# Patient Record
Sex: Female | Born: 1985 | Race: White | Hispanic: No | Marital: Single | Smoking: Never smoker
Health system: Southern US, Community
[De-identification: ages and names within clinical notes are randomized; demographics above are authoritative.]

## PROBLEM LIST (undated history)

## (undated) DIAGNOSIS — E039 Hypothyroidism, unspecified: Secondary | ICD-10-CM

## (undated) HISTORY — DX: Hypothyroidism, unspecified: E03.9

## (undated) HISTORY — PX: CHOLECYSTECTOMY: SHX55

---
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
## 2009-08-27 HISTORY — PX: APPENDECTOMY: SHX54

## 2013-07-17 LAB — BASIC METABOLIC PANEL
Creatinine: 1.1 mg/dL (ref 0.5–1.1)
Glucose: 89 mg/dL
Potassium: 4.1 mmol/L (ref 3.4–5.3)
Sodium: 142 mmol/L (ref 137–147)

## 2013-07-17 LAB — LIPASE, BLOOD: Lipase: 149

## 2013-07-17 LAB — HEPATIC FUNCTION PANEL
ALT: 16 U/L (ref 7–35)
AST: 16 U/L (ref 13–35)
Alkaline Phosphatase: 70 U/L (ref 25–125)

## 2013-07-17 LAB — CMP14+LP+1AC+CBC/D/PLT+TSH: Calcium: 9.5 mg/dL

## 2013-07-17 LAB — CBC AND DIFFERENTIAL
Hemoglobin: 13.5 g/dL (ref 12.0–16.0)
Platelets: 362 10*3/uL (ref 150–399)
WBC: 6.8 10^3/mL

## 2014-08-10 ENCOUNTER — Encounter: Payer: Self-pay | Admitting: Family Medicine

## 2014-08-10 ENCOUNTER — Ambulatory Visit (INDEPENDENT_AMBULATORY_CARE_PROVIDER_SITE_OTHER): Payer: BC Managed Care – PPO | Admitting: Family Medicine

## 2014-08-10 VITALS — BP 107/78 | HR 82 | Temp 98.2°F | Wt 144.0 lb

## 2014-08-10 DIAGNOSIS — K253 Acute gastric ulcer without hemorrhage or perforation: Secondary | ICD-10-CM | POA: Diagnosis not present

## 2014-08-10 DIAGNOSIS — E039 Hypothyroidism, unspecified: Secondary | ICD-10-CM | POA: Diagnosis not present

## 2014-08-10 MED ORDER — SUCRALFATE 1 GM/10ML PO SUSP: 1.0000 g | Freq: Three times a day (TID) | ORAL | Status: DC

## 2014-08-10 MED ORDER — PANTOPRAZOLE SODIUM 40 MG PO TBEC: 40.0000 mg | DELAYED_RELEASE_TABLET | Freq: Every day | ORAL | Status: DC

## 2014-08-10 NOTE — Progress Notes (Signed)
CC: Cynthia RancherKelly Pranger is a 28 y.o. female is here for Establish Care and GI issues   Subjective: HPI:  Very pleasant 28 year old here to establish care  Patient complains of left upper quadrant pain that began on the seventh of this month. It is described as sharp and nonradiating. He was initially worse with eating however over the past 48 hours she's had no pain whatsoever and feels like she is back to her regular state of health. On the first day that it presented she had what she believes was a blood in her vomit further described as brown and chunky. She had another episode of this on Saturday this past weekend. She was seen at a local emergency room and was found to have a normal hemoglobin and a CT scan of abdomen and pelvis which was unremarkable.  Liver function was also normal with a normal lipase. Treatment has included sucralfate which greatly improves her symptoms temporarily and she has begun taking Prevacid 50 mg twice a day.  She denies any bright red blood or coffee ground appearance to her emesis.  She has a history of similar issues that happened about a year ago. She even had an upper endoscopy which showed a bleeding ulcer this was treated with 6 months of DEXILANT. And she's been pain-free for many months just prior to the presentation above.   She denies caffeine ingestion, alcohol use nor any chronic nonsteroidal anti-inflammatory use  Review of Systems - General ROS: negative for - chills, fever, night sweats, weight gain or weight loss Ophthalmic ROS: negative for - decreased vision Psychological ROS: negative for - anxiety or depression ENT ROS: negative for - hearing change, nasal congestion, tinnitus or allergies Hematological and Lymphatic ROS: negative for - bleeding problems, bruising or swollen lymph nodes Breast ROS: negative Respiratory ROS: no cough, shortness of breath, or wheezing Cardiovascular ROS: no chest pain or dyspnea on exertion Gastrointestinal ROS: no  current abdominal pain, change in bowel habits, or black or bloody stools. She denies diarrhea or constipation. Genito-Urinary ROS: negative for - genital discharge, genital ulcers, incontinence or abnormal bleeding from genitals Musculoskeletal ROS: negative for - joint pain or muscle pain Neurological ROS: negative for - headaches or memory loss Dermatological ROS: negative for lumps, mole changes, rash and skin lesion changes  Past Medical History  Diagnosis Date  . Hypothyroid     Past Surgical History  Procedure Laterality Date  . Tonsillectomy  1996  . Ankle surgery  2001  . Knee surgery  2006  . Appendectomy  2011  . Cholecystectomy     History reviewed. No pertinent family history.  History   Social History  . Marital Status: Single    Spouse Name: N/A    Number of Children: N/A  . Years of Education: N/A   Occupational History  . Not on file.   Social History Main Topics  . Smoking status: Never Smoker   . Smokeless tobacco: Not on file  . Alcohol Use: 0.0 oz/week    0 Not specified per week     Comment: occasional  . Drug Use: No  . Sexual Activity:    Partners: Female   Other Topics Concern  . Not on file   Social History Narrative  . No narrative on file     Objective: BP 107/78 mmHg  Pulse 82  Temp(Src) 98.2 F (36.8 C) (Oral)  Wt 144 lb (65.318 kg)  LMP 08/09/2014 (Exact Date)  General: Alert and Oriented, No  Acute Distress HEENT: Pupils equal, round, reactive to light. Conjunctivae clearMoist mucous membranes lungs: Clear comfortable work of breathing Cardiac: Regular rate and rhythm.  Abdomen: Normal bowel sounds, soft and non tender without palpable masses. no rebound or guarding  Extremities: No peripheral edema.  Strong peripheral pulses.  Mental Status: No depression, anxiety, nor agitation. Skin: Warm and dry.  Assessment & Plan: Tresa EndoKelly was seen today for establish care and gi issues.  Diagnoses and associated orders for this  visit:  Hypothyroidism, unspecified hypothyroidism type  Acute gastric ulcer - sucralfate (CARAFATE) 1 GM/10ML suspension; Take 10 mLs (1 g total) by mouth 4 (four) times daily -  with meals and at bedtime. - pantoprazole (PROTONIX) 40 MG tablet; Take 1 tablet (40 mg total) by mouth daily.     suspect gastric ulcer has returned, discussed using sucralfate only on an as-needed basis and that if she begins Protonix 40 mg daily this should have fully healed within 3 months and she can stop Protonix and taper down antacid regimen with ranitidine 150 mg daily for 1 week. Signs and symptoms requring emergent/urgent reevaluation were discussed with the patient.  Hypothyroidism: Clinically controlled continue levothyroxine 100 g on a daily basis will be due for TSH in the spring  Return if symptoms worsen or fail to improve.

## 2014-08-13 ENCOUNTER — Encounter: Payer: Self-pay | Admitting: Family Medicine

## 2014-08-13 DIAGNOSIS — Z8719 Personal history of other diseases of the digestive system: Secondary | ICD-10-CM | POA: Insufficient documentation

## 2014-08-31 ENCOUNTER — Ambulatory Visit (INDEPENDENT_AMBULATORY_CARE_PROVIDER_SITE_OTHER): Payer: BC Managed Care – PPO | Admitting: Family Medicine

## 2014-08-31 ENCOUNTER — Encounter: Payer: Self-pay | Admitting: Family Medicine

## 2014-08-31 VITALS — BP 129/78 | HR 97 | Temp 97.8°F | Wt 143.0 lb

## 2014-08-31 DIAGNOSIS — K921 Melena: Secondary | ICD-10-CM | POA: Diagnosis not present

## 2014-08-31 DIAGNOSIS — R1013 Epigastric pain: Secondary | ICD-10-CM | POA: Diagnosis not present

## 2014-08-31 DIAGNOSIS — K253 Acute gastric ulcer without hemorrhage or perforation: Secondary | ICD-10-CM | POA: Diagnosis not present

## 2014-08-31 LAB — POCT HEMOGLOBIN: Hemoglobin: 11.6 g/dL — AB (ref 12.2–16.2)

## 2014-08-31 MED ORDER — PANTOPRAZOLE SODIUM 40 MG PO TBEC: 40.0000 mg | DELAYED_RELEASE_TABLET | Freq: Two times a day (BID) | ORAL | Status: DC

## 2014-08-31 MED ORDER — TRAMADOL HCL 50 MG PO TABS: 50.0000 mg | ORAL_TABLET | Freq: Three times a day (TID) | ORAL | Status: DC | PRN

## 2014-08-31 MED ORDER — SUCRALFATE 1 GM/10ML PO SUSP: 1.0000 g | Freq: Three times a day (TID) | ORAL | Status: DC

## 2014-08-31 NOTE — Progress Notes (Signed)
CC: Cynthia Jennings is a 29 y.o. female is here for black tarry diarrhea/abd pain   Subjective: HPI:  Reports left upper quadrant pain that began yesterday with an abrupt onset that was accompanied by a a sensation of lightheadedness. Symptoms have been persistent ever since onset. Abdominal pain seems to be worsening now 7 out of 10 in severity. It is radiating into the back. It is worse with pressing on the abdomen or when eating. this morning she's had passage of loose liquidy black stool. Overall she states she feels unwell. She tells me that symptoms are identical to a bleeding ulcer that she had a little over a year ago.  She continues to take Protonix 40 mg daily, she's had no need to take sucralfate since I saw her last. She has not taken any nonsteroidal anti-inflammatory since I saw her last. She reports decreased appetite but no nausea or vomiting. Denies fevers, chills, cough, shortness of breath, myalgias, norare there any genitourinary complaints.   Review Of Systems Outlined In HPI  Past Medical History  Diagnosis Date  . Hypothyroid     Past Surgical History  Procedure Laterality Date  . Tonsillectomy  1996  . Ankle surgery  2001  . Knee surgery  2006  . Appendectomy  2011  . Cholecystectomy     No family history on file.  History   Social History  . Marital Status: Single    Spouse Name: N/A    Number of Children: N/A  . Years of Education: N/A   Occupational History  . Not on file.   Social History Main Topics  . Smoking status: Never Smoker   . Smokeless tobacco: Not on file  . Alcohol Use: 0.0 oz/week    0 Not specified per week     Comment: occasional  . Drug Use: No  . Sexual Activity:    Partners: Female   Other Topics Concern  . Not on file   Social History Narrative  . No narrative on file     Objective: BP 129/78 mmHg  Pulse 97  Temp(Src) 97.8 F (36.6 C) (Oral)  Wt 143 lb (64.864 kg)  LMP 08/09/2014 (Exact Date)  General: Alert and  Oriented, No Acute Distress HEENT: Pupils equal, round, reactive to light. Conjunctivae clear.  External ears unremarkable, canals clear with intact TMs with appropriate landmarks.  Middle ear appears open without effusion. Pink inferior turbinates. Moist mucous membranes Lungs: Clear to auscultation bilaterally, no wheezing/ronchi/rales.  Comfortable work of breathing. Good air movement. Cardiac: Regular rate and rhythm. Normal S1/S2.  No murmurs, rubs, nor gallops.   Abdomen: Normal bowel sounds, soft without rebound or rigidity. Pain reproduced with palpation of epigastric and left upper quadrant area no palpable splenomegaly or hepatomegaly. No palpable masses. Extremities: No peripheral edema.  Strong peripheral pulses.  Mental Status: No depression, anxiety, nor agitation. Skin: Warm and dry.  Assessment & Plan: [name] was seen today for black tarry diarrhea/abd pain.  Diagnoses and associated orders for this visit:  Blood in stool - POCT hemoglobin - Ambulatory referral to Gastroenterology  Abdominal pain, epigastric - traMADol (ULTRAM) 50 MG tablet; Take 1 tablet (50 mg total) by mouth every 8 (eight) hours as needed for moderate pain. - Ambulatory referral to Gastroenterology  Acute gastric ulcer - sucralfate (CARAFATE) 1 GM/10ML suspension; Take 10 mLs (1 g total) by mouth 4 (four) times daily -  with meals and at bedtime. - pantoprazole (PROTONIX) 40 MG tablet; Take 1 tablet (40  mg total) by mouth 2 (two) times daily.    Highly suspicious that she has a new gastric or duodenal ulcer also in her pain which is also bleeding. Fortunately hemoglobin is not dangerously low and I do not think that she needs emergent evaluation however she does warrant urgent evaluation for consideration of EGD. Fortunately we were able to get a 230 appointment with [facility] My plan involves increasing Protonix to twice a day, as needed sucralfate, and tramadol as needed  to be revised if needed by gastroenterology.  Return if symptoms worsen or fail to improve.

## 2014-09-02 ENCOUNTER — Encounter: Payer: Self-pay | Admitting: Family Medicine

## 2014-09-02 DIAGNOSIS — Z8669 Personal history of other diseases of the nervous system and sense organs: Secondary | ICD-10-CM | POA: Insufficient documentation

## 2014-10-11 ENCOUNTER — Telehealth: Payer: Self-pay

## 2014-10-11 NOTE — Telephone Encounter (Signed)
Faxed PA for Pantoprazole sodium Tablets and waiting on auth - CF

## 2014-10-12 ENCOUNTER — Telehealth: Payer: Self-pay | Admitting: *Deleted

## 2014-10-12 NOTE — Telephone Encounter (Signed)
Received fax for Pantoprazole Sodium has been approved. Effective 09/12/14 thru 10/12/15.

## 2014-10-12 NOTE — Telephone Encounter (Addendum)
  Received fax for Pantoprazole Sodium. Approved effective 09/12/14 thru 10/12/15.

## 2014-11-05 ENCOUNTER — Encounter: Payer: Self-pay | Admitting: Family Medicine

## 2014-12-18 ENCOUNTER — Other Ambulatory Visit: Payer: Self-pay | Admitting: Family Medicine

## 2015-02-02 ENCOUNTER — Ambulatory Visit: Payer: BC Managed Care – PPO | Admitting: Family Medicine

## 2015-02-24 ENCOUNTER — Emergency Department
Admission: EM | Admit: 2015-02-24 | Discharge: 2015-02-24 | Disposition: A | Discharge status: Home / Self Care | Payer: BC Managed Care – PPO | Source: Home / Self Care | Attending: Family Medicine | Admitting: Family Medicine

## 2015-02-24 ENCOUNTER — Encounter: Payer: Self-pay | Admitting: Emergency Medicine

## 2015-02-24 DIAGNOSIS — H6091 Unspecified otitis externa, right ear: Secondary | ICD-10-CM | POA: Diagnosis not present

## 2015-02-24 MED ORDER — NEOMYCIN-POLYMYXIN-HC 3.5-10000-1 OT SUSP: 4.0000 [drp] | Freq: Four times a day (QID) | OTIC | Status: DC

## 2015-02-24 NOTE — Discharge Instructions (Signed)
Today you were diagnosed with otitis externa, commonly known as swimmer's ear.  Please refrain from swimming this week or use ear plugs to help prevent additional water getting into your ear.  Please use antibiotic ear drops as prescribed.  You may take over the counter tylenol and ibuprofen as needed for pain.  Follow up with Dr. Ivan AnchorsHommel in 3-4 days if not improving.  If symptoms worsen including redness on outside of your ear, severe pain, or other new concerning symptoms develop, please return to urgent care for further evaluation.  See below for further instructions.

## 2015-02-24 NOTE — ED Notes (Signed)
Reports right ear pain x 5 days; taking tylenol.

## 2015-02-24 NOTE — ED Provider Notes (Signed)
CSN: [id number]     Arrival date & time 02/24/15  0907 History   First MD Initiated Contact with Patient 02/24/15 [phone]     Chief Complaint  Patient presents with  . Otalgia   (Consider location/radiation/quality/duration/timing/severity/associated sxs/prior Treatment) HPI Pt is a 29yo female presenting to UC with c/o gradually worsening 5 day hx of Right ear pain that is burning, aching and itching.  Itching is moderate in severity.  She has tried tylenol but no relief.  Pt reports swimming more recently at the gym and concerned she has gotten water in her ear.  She has also tried using peroxide in her ear but that made pain worse. Denies cough, congestion, sore throat, fever, chills, n/v/d. Denies recent travel or sick contacts.    Past Medical History  Diagnosis Date  . Hypothyroid    Past Surgical History  Procedure Laterality Date  . Tonsillectomy  1996  . Ankle surgery  2001  . Knee surgery  2006  . Appendectomy  2011  . Cholecystectomy     History reviewed. No pertinent family history. History  Substance Use Topics  . Smoking status: Never Smoker   . Smokeless tobacco: Not on file  . Alcohol Use: 0.0 oz/week    0 Standard drinks or equivalent per week     Comment: occasional   OB History    No data available     Review of Systems  Constitutional: Negative for fever, chills, diaphoresis, appetite change and fatigue.  HENT: Positive for ear pain (Right, associated itching). Negative for congestion, ear discharge, sore throat, trouble swallowing and voice change.   Respiratory: Negative for cough and shortness of breath.   Cardiovascular: Negative for chest pain and palpitations.  Gastrointestinal: Negative for nausea, vomiting, abdominal pain, diarrhea and constipation.    Allergies  Keflex and Sulfa antibiotics  Home Medications   Prior to Admission medications   Medication Sig Start Date End Date Taking? Authorizing Provider  levothyroxine (SYNTHROID,  LEVOTHROID) 100 MCG tablet Take 100 mcg by mouth daily before breakfast.    Historical Provider, MD  neomycin-polymyxin-hydrocortisone (CORTISPORIN) 3.5-10000-1 otic suspension Place 4 drops into the right ear 4 (four) times daily. X 7 days 02/24/15   Junius FinnerErin O'Malley, PA-C  ondansetron (ZOFRAN) 4 MG tablet Take 4 mg by mouth every 8 (eight) hours as needed for nausea or vomiting.    Historical Provider, MD  pantoprazole (PROTONIX) 40 MG tablet TAKE 1 TABLET BY MOUTH TWICE A DAY 12/20/14   Sean Hommel, DO  sucralfate (CARAFATE) 1 GM/10ML suspension Take 10 mLs (1 g total) by mouth 4 (four) times daily -  with meals and at bedtime. 08/31/14   Sean Hommel, DO  traMADol (ULTRAM) 50 MG tablet Take 1 tablet (50 mg total) by mouth every 8 (eight) hours as needed for moderate pain. 08/31/14   Sean Hommel, DO   BP 103/66 mmHg  Pulse 78  Temp(Src) 98.5 F (36.9 C) (Oral)  Resp 16  Ht 5\' 7"  (1.702 m)  Wt 153 lb (69.4 kg)  BMI 23.96 kg/m2  SpO2 100%  LMP 02/07/2015 Physical Exam  Constitutional: She appears well-developed and well-nourished. No distress.  HENT:  Head: Normocephalic and atraumatic.  Right Ear: There is swelling ( in ear canal) and tenderness ( erythematous ear canal). No drainage. No foreign bodies. No mastoid tenderness. Tympanic membrane is not injected, not perforated, not erythematous, not retracted and not bulging. No middle ear effusion.  Left Ear: No drainage, swelling or tenderness. No  foreign bodies. No mastoid tenderness. Tympanic membrane is not injected, not perforated, not erythematous, not retracted and not bulging.  No middle ear effusion.  Nose: Nose normal. Right sinus exhibits no maxillary sinus tenderness and no frontal sinus tenderness. Left sinus exhibits no maxillary sinus tenderness and no frontal sinus tenderness.  Mouth/Throat: Uvula is midline, oropharynx is clear and moist and mucous membranes are normal.  Eyes: Conjunctivae are normal. No scleral icterus.  Neck:  Normal range of motion. Neck supple.  Cardiovascular: Normal rate, regular rhythm and normal heart sounds.   Pulmonary/Chest: Effort normal and breath sounds normal. No respiratory distress. She has no wheezes. She has no rales. She exhibits no tenderness.  Abdominal: Soft. Bowel sounds are normal. She exhibits no distension and no mass. There is no tenderness. There is no rebound and no guarding.  Musculoskeletal: Normal range of motion.  Lymphadenopathy:    She has no cervical adenopathy.  Neurological: She is alert.  Skin: Skin is warm and dry. She is not diaphoretic.  Nursing note and vitals reviewed.   ED Course  Procedures (including critical care time) Labs Review Labs Reviewed - No data to display  Imaging Review No results found.   MDM   1. Right otitis externa    Pt c/o Right ear pain and itching. Recently using the swimming pool more. No other symptoms. Vitals: WNL.  Exam c/w Otitis externa. No evidence of mastoiditis.  Will tx with ophthalmic drops: Cortisporin.   Encouraged pt to refrain from swimming this week or use ear plugs to help prevent additional water getting into your ear.  Advised she may take over the counter tylenol and ibuprofen as needed for pain.  Follow up with Dr. Ivan Anchors in 3-4 days if not improving.  If symptoms worsen including redness on outside of your ear, severe pain, or other new concerning symptoms develop, please return to urgent care for further evaluation. Pt verbalized understanding and agreement with tx plan.    Junius Finner, PA-C 02/24/15 [phone]

## 2015-03-02 ENCOUNTER — Emergency Department
Admission: EM | Admit: 2015-03-02 | Discharge: 2015-03-02 | Disposition: A | Discharge status: Home / Self Care | Payer: BC Managed Care – PPO | Source: Home / Self Care | Attending: Family Medicine | Admitting: Family Medicine

## 2015-03-02 ENCOUNTER — Encounter: Payer: Self-pay | Admitting: Emergency Medicine

## 2015-03-02 ENCOUNTER — Telehealth: Payer: Self-pay | Admitting: Emergency Medicine

## 2015-03-02 DIAGNOSIS — H6501 Acute serous otitis media, right ear: Secondary | ICD-10-CM

## 2015-03-02 MED ORDER — AMOXICILLIN 500 MG PO CAPS: 500.0000 mg | ORAL_CAPSULE | Freq: Two times a day (BID) | ORAL | Status: DC

## 2015-03-02 NOTE — ED Notes (Signed)
Pt c/o right ear pain. Pt seen here on 6/30 and has been using abx drops and no improvement.

## 2015-03-02 NOTE — ED Provider Notes (Signed)
CSN: [id number]     Arrival date & time 03/02/15  1555 History   First MD Initiated Contact with Patient 03/02/15 1603     Chief Complaint  Patient presents with  . Otalgia   (Consider location/radiation/quality/duration/timing/severity/associated sxs/prior Treatment) HPI Pt is a 29yo female presenting to UC for recheck of constant Right ear pain that is gradually worsening despite use of cortisporin ear drops for otitis externa, dx on 6/30.  Pt has 1 day left of antibiotic. Pain is moderate in severity, aching and throbbing, no longer itches.  She has been back to the pool but has not dunked her head. Reports subjective fever and chills but no n/v/d. Denies throat pain cough or congestion.   Past Medical History  Diagnosis Date  . Hypothyroid    Past Surgical History  Procedure Laterality Date  . Tonsillectomy  1996  . Ankle surgery  2001  . Knee surgery  2006  . Appendectomy  2011  . Cholecystectomy     History reviewed. No pertinent family history. History  Substance Use Topics  . Smoking status: Never Smoker   . Smokeless tobacco: Not on file  . Alcohol Use: 0.0 oz/week    0 Standard drinks or equivalent per week     Comment: occasional   OB History    No data available     Review of Systems  Constitutional: Positive for fever ( subjective) and chills. Negative for appetite change.  HENT: Positive for congestion, ear discharge and ear pain (right). Negative for dental problem, drooling, sore throat, trouble swallowing and voice change.   Respiratory: Negative for cough and shortness of breath.   Gastrointestinal: Negative for nausea, vomiting, abdominal pain and diarrhea.    Allergies  Keflex and Sulfa antibiotics  Home Medications   Prior to Admission medications   Medication Sig Start Date End Date Taking? Authorizing Provider  amoxicillin (AMOXIL) 500 MG capsule Take 1 capsule (500 mg total) by mouth 2 (two) times daily. For 10 days 03/02/15   Junius FinnerErin O'Malley, PA-C   levothyroxine (SYNTHROID, LEVOTHROID) 100 MCG tablet Take 100 mcg by mouth daily before breakfast.    Historical Provider, MD  neomycin-polymyxin-hydrocortisone (CORTISPORIN) 3.5-10000-1 otic suspension Place 4 drops into the right ear 4 (four) times daily. X 7 days 02/24/15   Junius FinnerErin O'Malley, PA-C  ondansetron (ZOFRAN) 4 MG tablet Take 4 mg by mouth every 8 (eight) hours as needed for nausea or vomiting.    Historical Provider, MD  pantoprazole (PROTONIX) 40 MG tablet TAKE 1 TABLET BY MOUTH TWICE A DAY 12/20/14   Sean Hommel, DO  sucralfate (CARAFATE) 1 GM/10ML suspension Take 10 mLs (1 g total) by mouth 4 (four) times daily -  with meals and at bedtime. 08/31/14   Sean Hommel, DO  traMADol (ULTRAM) 50 MG tablet Take 1 tablet (50 mg total) by mouth every 8 (eight) hours as needed for moderate pain. 08/31/14   Sean Hommel, DO   BP 109/72 mmHg  Pulse 62  Temp(Src) 98.3 F (36.8 C) (Oral)  SpO2 100%  LMP 02/07/2015 Physical Exam  Constitutional: She is oriented to person, place, and time. She appears well-developed and well-nourished.  HENT:  Head: Normocephalic and atraumatic.  Right Ear: Hearing and ear canal normal. There is tenderness. No drainage. No foreign bodies. No mastoid tenderness. Tympanic membrane is injected and erythematous. Tympanic membrane is not retracted and not bulging. A middle ear effusion is present.  Left Ear: Tympanic membrane, external ear and ear canal normal. No  drainage or tenderness. No foreign bodies. No mastoid tenderness. Tympanic membrane is not injected, not retracted and not bulging.  No middle ear effusion.  Nose: Nose normal.  Mouth/Throat: Uvula is midline, oropharynx is clear and moist and mucous membranes are normal.  Eyes: EOM are normal.  Neck: Normal range of motion. Neck supple.  No nuchal rigidity or meningeal signs.  Cardiovascular: Normal rate, regular rhythm and normal heart sounds.   Pulmonary/Chest: Effort normal and breath sounds normal. No  stridor. No respiratory distress.  Musculoskeletal: Normal range of motion.  Lymphadenopathy:    She has no cervical adenopathy.  Neurological: She is alert and oriented to person, place, and time.  Skin: Skin is warm and dry.  Psychiatric: She has a normal mood and affect. Her behavior is normal.  Nursing note and vitals reviewed.   ED Course  Procedures (including critical care time) Labs Review Labs Reviewed - No data to display  Imaging Review No results found.   MDM   1. Right acute serous otitis media, recurrence not specified      Pt is a 29yo female seen on 6/30, dx with cortisporin ear drops, no improvement after 1 week of treatment. Exam today more c/w AOM. No evidence of mastoiditis. No dental disease or injury. No meningeal signs. Will start pt on oral antibiotic, amoxicillin. Advised pt to use acetaminophen and ibuprofen as needed for fever and pain. Encouraged rest and fluids. Return precautions provided. Pt verbalized understanding and agreement with tx plan.     Junius Finner, PA-C 03/02/15 1640

## 2015-03-24 ENCOUNTER — Ambulatory Visit (INDEPENDENT_AMBULATORY_CARE_PROVIDER_SITE_OTHER): Payer: BC Managed Care – PPO | Admitting: Family Medicine

## 2015-03-24 ENCOUNTER — Encounter: Payer: Self-pay | Admitting: Family Medicine

## 2015-03-24 VITALS — BP 110/77 | HR 69 | Temp 98.4°F | Wt 154.0 lb

## 2015-03-24 DIAGNOSIS — E039 Hypothyroidism, unspecified: Secondary | ICD-10-CM

## 2015-03-24 DIAGNOSIS — L0390 Cellulitis, unspecified: Secondary | ICD-10-CM | POA: Diagnosis not present

## 2015-03-24 DIAGNOSIS — B9562 Methicillin resistant Staphylococcus aureus infection as the cause of diseases classified elsewhere: Secondary | ICD-10-CM | POA: Diagnosis not present

## 2015-03-24 MED ORDER — LEVOTHYROXINE SODIUM 75 MCG PO TABS: 75.0000 ug | ORAL_TABLET | Freq: Every day | ORAL | Status: DC

## 2015-03-24 MED ORDER — DOXYCYCLINE HYCLATE 100 MG PO TABS: ORAL_TABLET | ORAL | Status: AC

## 2015-03-24 NOTE — Progress Notes (Signed)
CC: Cynthia Jennings is a 29 y.o. female is here for concerned about staph infection under left armpit   Subjective: HPI:  2 painful pimples in the left armpit that has been present for the past 24 hours. Pain is moderate in severity worse with touching. She believes that the pupils are enlarging at a rapid speed. She denies any discharge from the sites. She denies fevers, chills, swollen lymph nodes, nor skin changes elsewhere. She's had this before in the past and she was ignored it for a few days and it turned out to be MRSA requiring incision and drainage. No interventions as of yet  She would like to know if we can go down on her thyroid medication. She's been having heat intolerance and irritability towards others. These are identical symptoms that reflected to high the dose of levothyroxine in the past. She had a TSH checked earlier this month and was on the high side of normal.   Review Of Systems Outlined In HPI  Past Medical History  Diagnosis Date  . Hypothyroid     Past Surgical History  Procedure Laterality Date  . Tonsillectomy  1996  . Ankle surgery  2001  . Knee surgery  2006  . Appendectomy  2011  . Cholecystectomy     No family history on file.  History   Social History  . Marital Status: Single    Spouse Name: N/A  . Number of Children: N/A  . Years of Education: N/A   Occupational History  . Not on file.   Social History Main Topics  . Smoking status: Never Smoker   . Smokeless tobacco: Not on file  . Alcohol Use: 0.0 oz/week    0 Standard drinks or equivalent per week     Comment: occasional  . Drug Use: No  . Sexual Activity:    Partners: Female   Other Topics Concern  . Not on file   Social History Narrative     Objective: BP 110/77 mmHg  Pulse 69  Temp(Src) 98.4 F (36.9 C) (Oral)  Wt 154 lb (69.854 kg)  LMP 02/07/2015  Vital signs reviewed. General: Alert and Oriented, No Acute Distress HEENT: Pupils equal, round, reactive to light.  Conjunctivae clear.  External ears unremarkable.  Moist mucous membranes. Lungs: Clear and comfortable work of breathing, speaking in full sentences without accessory muscle use. Cardiac: Regular rate and rhythm.  Neuro: CN II-XII grossly intact, gait normal. Extremities: No peripheral edema.  Strong peripheral pulses.  Mental Status: No depression, anxiety, nor agitation. Logical though process. Skin: Warm and dry. 2 small pustules in the left armpit, outlined with a surgical pen  Assessment & Plan: Cheridan was seen today for concerned about staph infection under left armpit.  Diagnoses and all orders for this visit:  MRSA cellulitis Orders: -     doxycycline (VIBRA-TABS) 100 MG tablet; One by mouth twice a day for ten days.  Hypothyroidism, unspecified hypothyroidism type Orders: -     levothyroxine (SYNTHROID, LEVOTHROID) 75 MCG tablet; Take 1 tablet (75 mcg total) by mouth daily before breakfast.   MRSA cellulitis: Start doxycycline, advised her to call our after hours If this gets progressive over the weekend for consideration to switch to clindamycin. She has a sulfa allergy. Hypothyroidism: Lowering the dose of levothyroxine with a repeat TSH due in 1 month, prepared her that it's likely her TSH will go up and she'll need to restart the 100 g levothyroxine.  Return in about 4 weeks (around  04/21/2015), or if symptoms worsen or fail to improve.

## 2015-06-10 ENCOUNTER — Telehealth: Payer: Self-pay | Admitting: *Deleted

## 2015-06-10 DIAGNOSIS — E039 Hypothyroidism, unspecified: Secondary | ICD-10-CM

## 2015-06-10 NOTE — Telephone Encounter (Signed)
TSH ordered & faxed to [facility].

## 2015-06-15 ENCOUNTER — Telehealth: Payer: Self-pay | Admitting: Family Medicine

## 2015-06-15 DIAGNOSIS — E039 Hypothyroidism, unspecified: Secondary | ICD-10-CM

## 2015-06-15 LAB — TSH: TSH: 3.904 u[IU]/mL (ref 0.350–4.500)

## 2015-06-15 MED ORDER — LEVOTHYROXINE SODIUM 75 MCG PO TABS: 75.0000 ug | ORAL_TABLET | Freq: Every day | ORAL | Status: DC

## 2015-06-15 NOTE — Telephone Encounter (Signed)
Patient advised.

## 2015-06-15 NOTE — Telephone Encounter (Signed)
Cynthia Jennings, Will you please let patient know her thyroid supplement appears to be adequate therefore I sent refills to [facility] on [address]

## 2015-08-31 ENCOUNTER — Encounter: Payer: Self-pay | Admitting: Family Medicine

## 2015-08-31 ENCOUNTER — Ambulatory Visit (INDEPENDENT_AMBULATORY_CARE_PROVIDER_SITE_OTHER): Payer: BC Managed Care – PPO | Admitting: Family Medicine

## 2015-08-31 VITALS — BP 109/70 | HR 89 | Temp 98.2°F | Wt 162.0 lb

## 2015-08-31 DIAGNOSIS — J209 Acute bronchitis, unspecified: Secondary | ICD-10-CM | POA: Diagnosis not present

## 2015-08-31 MED ORDER — IPRATROPIUM BROMIDE 0.06 % NA SOLN: 2.0000 | Freq: Four times a day (QID) | NASAL | Status: DC

## 2015-08-31 MED ORDER — PROMETHAZINE-CODEINE 6.25-10 MG/5ML PO SYRP: 5.0000 mL | ORAL_SOLUTION | Freq: Every evening | ORAL | Status: DC | PRN

## 2015-08-31 MED ORDER — PREDNISONE 10 MG PO TABS: 30.0000 mg | ORAL_TABLET | Freq: Every day | ORAL | Status: DC

## 2015-08-31 MED ORDER — AZITHROMYCIN 250 MG PO TABS: 250.0000 mg | ORAL_TABLET | Freq: Every day | ORAL | Status: DC

## 2015-08-31 MED ORDER — BENZONATATE 200 MG PO CAPS: 200.0000 mg | ORAL_CAPSULE | Freq: Three times a day (TID) | ORAL | Status: DC | PRN

## 2015-08-31 NOTE — Patient Instructions (Signed)
Thank you for coming in today. Call or go to the emergency room if you get worse, have trouble breathing, have chest pains, or palpitations.  \ Use prednisone and azithromycin if not better.   Acute Bronchitis Bronchitis is inflammation of the airways that extend from the windpipe into the lungs (bronchi). The inflammation often causes mucus to develop. This leads to a cough, which is the most common symptom of bronchitis.  In acute bronchitis, the condition usually develops suddenly and goes away over time, usually in a couple weeks. Smoking, allergies, and asthma can make bronchitis worse. Repeated episodes of bronchitis may cause further lung problems.  CAUSES Acute bronchitis is most often caused by the same virus that causes a cold. The virus can spread from person to person (contagious) through coughing, sneezing, and touching contaminated objects. SIGNS AND SYMPTOMS   Cough.   Fever.   Coughing up mucus.   Body aches.   Chest congestion.   Chills.   Shortness of breath.   Sore throat.  DIAGNOSIS  Acute bronchitis is usually diagnosed through a physical exam. Your health care provider will also ask you questions about your medical history. Tests, such as chest X-rays, are sometimes done to rule out other conditions.  TREATMENT  Acute bronchitis usually goes away in a couple weeks. Oftentimes, no medical treatment is necessary. Medicines are sometimes given for relief of fever or cough. Antibiotic medicines are usually not needed but may be prescribed in certain situations. In some cases, an inhaler may be recommended to help reduce shortness of breath and control the cough. A cool mist vaporizer may also be used to help thin bronchial secretions and make it easier to clear the chest.  HOME CARE INSTRUCTIONS  Get plenty of rest.   Drink enough fluids to keep your urine clear or pale yellow (unless you have a medical condition that requires fluid restriction).  Increasing fluids may help thin your respiratory secretions (sputum) and reduce chest congestion, and it will prevent dehydration.   Take medicines only as directed by your health care provider.  If you were prescribed an antibiotic medicine, finish it all even if you start to feel better.  Avoid smoking and secondhand smoke. Exposure to cigarette smoke or irritating chemicals will make bronchitis worse. If you are a smoker, consider using nicotine gum or skin patches to help control withdrawal symptoms. Quitting smoking will help your lungs heal faster.   Reduce the chances of another bout of acute bronchitis by washing your hands frequently, avoiding people with cold symptoms, and trying not to touch your hands to your mouth, nose, or eyes.   Keep all follow-up visits as directed by your health care provider.  SEEK MEDICAL CARE IF: Your symptoms do not improve after 1 week of treatment.  SEEK IMMEDIATE MEDICAL CARE IF:  You develop an increased fever or chills.   You have chest pain.   You have severe shortness of breath.  You have bloody sputum.   You develop dehydration.  You faint or repeatedly feel like you are going to pass out.  You develop repeated vomiting.  You develop a severe headache. MAKE SURE YOU:   Understand these instructions.  Will watch your condition.  Will get help right away if you are not doing well or get worse.   This information is not intended to replace advice given to you by your health care provider. Make sure you discuss any questions you have with your health care provider.  Document Released: 09/20/2004 Document Revised: 09/03/2014 Document Reviewed: 02/03/2013 Elsevier Interactive Patient Education [employer].

## 2015-08-31 NOTE — Progress Notes (Signed)
       Cynthia Jennings is a 30 y.o. female who [facility] today for cough and congestion. Patient has a several day history of cough and congestion. She has a cough is productive and has pain with deep inspiration. No fevers chills nausea vomiting or diarrhea. She's tried some over-the-counter medicines which helps some. Overall she feels well. No significant shortness of breath.   Past Medical History  Diagnosis Date  . Hypothyroid    Past Surgical History  Procedure Laterality Date  . Tonsillectomy  1996  . Ankle surgery  2001  . Knee surgery  2006  . Appendectomy  2011  . Cholecystectomy     Social History  Substance Use Topics  . Smoking status: Never Smoker   . Smokeless tobacco: Not on file  . Alcohol Use: 0.0 oz/week    0 Standard drinks or equivalent per week     Comment: occasional   family history is not on file.  ROS as above Medications: Current Outpatient Prescriptions  Medication Sig Dispense Refill  . fluticasone (FLONASE) 50 MCG/ACT nasal spray Place into the nose.    . levothyroxine (SYNTHROID, LEVOTHROID) 75 MCG tablet Take 1 tablet (75 mcg total) by mouth daily before breakfast. 90 tablet 1  . azithromycin (ZITHROMAX) 250 MG tablet Take 1 tablet (250 mg total) by mouth daily. Take first 2 tablets together, then 1 every day until finished. 6 tablet 0  . benzonatate (TESSALON) 200 MG capsule Take 1 capsule (200 mg total) by mouth 3 (three) times daily as needed for cough. 45 capsule 0  . cetirizine (ZYRTEC) 10 MG tablet Take 10 mg by mouth.    . EPINEPHrine (EPIPEN 2-PAK) 0.3 mg/0.3 mL IJ SOAJ injection Inject into the muscle.    . ipratropium (ATROVENT) 0.06 % nasal spray Place 2 sprays into both nostrils 4 (four) times daily. 15 mL 1  . Multiple Vitamin (MULTIVITAMIN) capsule Take 1 capsule by mouth.    . Omega-3 1000 MG CAPS Take 1 g by mouth.    .  predniSONE (DELTASONE) 10 MG tablet Take 3 tablets (30 mg total) by mouth daily. 15 tablet 0  . promethazine-codeine (PHENERGAN WITH CODEINE) 6.25-10 MG/5ML syrup Take 5 mLs by mouth at bedtime as needed for cough. 120 mL 0  . ranitidine (ZANTAC) 150 MG tablet Take 150 mg by mouth.    . WHEY PROTEIN PO Take 26 g/day by mouth.     No current facility-administered medications for this visit.   Allergies  Allergen Reactions  . Sulfites Anaphylaxis  . Codeine Nausea And Vomiting  . Keflex [Cephalexin]     unsure  . Sulfa Antibiotics     unknown  . Sulfamethoxazole Hives     Exam:  BP 109/70 mmHg  Pulse 89  Temp(Src) 98.2 F (36.8 C)  Wt 162 lb (73.483 kg)  SpO2 98% Gen: Well NAD nontoxic appearing HEENT: EOMI,  MMM posterior pharynx with cobblestoning. Normal tympanic membranes bilaterally. Clear nasal discharge. Normal tympanic membranes bilaterally. Lungs: Normal work of breathing. CTABL Heart: RRR no MRG Abd: NABS, Soft. Nondistended, Nontender Exts: Brisk capillary refill, warm and well perfused.   No results found for this or any previous visit (from the past 24 hour(s)). No results found.   Please see individual assessment and plan sections.

## 2015-08-31 NOTE — Assessment & Plan Note (Signed)
Symptoms consistent with viral bronchitis/sinusitis. Treat with adjuvant nasal spray and over-the-counter medicines. Use Tessalon and codeine for cough suppression. Use azithromycin and prednisone if not better.

## 2015-09-19 ENCOUNTER — Encounter: Payer: Self-pay | Admitting: Family Medicine

## 2015-09-19 ENCOUNTER — Ambulatory Visit (INDEPENDENT_AMBULATORY_CARE_PROVIDER_SITE_OTHER): Payer: BC Managed Care – PPO

## 2015-09-19 ENCOUNTER — Ambulatory Visit (INDEPENDENT_AMBULATORY_CARE_PROVIDER_SITE_OTHER): Payer: BC Managed Care – PPO | Admitting: Family Medicine

## 2015-09-19 VITALS — BP 118/74 | HR 89 | Temp 98.3°F | Wt 166.0 lb

## 2015-09-19 DIAGNOSIS — R05 Cough: Secondary | ICD-10-CM

## 2015-09-19 DIAGNOSIS — J209 Acute bronchitis, unspecified: Secondary | ICD-10-CM

## 2015-09-19 MED ORDER — PREDNISONE 10 MG PO TABS: 30.0000 mg | ORAL_TABLET | Freq: Every day | ORAL | Status: DC

## 2015-09-19 MED ORDER — AZITHROMYCIN 250 MG PO TABS: 250.0000 mg | ORAL_TABLET | Freq: Every day | ORAL | Status: DC

## 2015-09-19 MED ORDER — BENZONATATE 200 MG PO CAPS: 200.0000 mg | ORAL_CAPSULE | Freq: Three times a day (TID) | ORAL | Status: DC | PRN

## 2015-09-19 NOTE — Patient Instructions (Signed)
Thank you for coming in today. Call or go to the emergency room if you get worse, have trouble breathing, have chest pains, or palpitations.  Return if not better.  Get chest xray today.  Take the medicines.   Acute Bronchitis Bronchitis is inflammation of the airways that extend from the windpipe into the lungs (bronchi). The inflammation often causes mucus to develop. This leads to a cough, which is the most common symptom of bronchitis.  In acute bronchitis, the condition usually develops suddenly and goes away over time, usually in a couple weeks. Smoking, allergies, and asthma can make bronchitis worse. Repeated episodes of bronchitis may cause further lung problems.  CAUSES Acute bronchitis is most often caused by the same virus that causes a cold. The virus can spread from person to person (contagious) through coughing, sneezing, and touching contaminated objects. SIGNS AND SYMPTOMS   Cough.   Fever.   Coughing up mucus.   Body aches.   Chest congestion.   Chills.   Shortness of breath.   Sore throat.  DIAGNOSIS  Acute bronchitis is usually diagnosed through a physical exam. Your health care provider will also ask you questions about your medical history. Tests, such as chest X-rays, are sometimes done to rule out other conditions.  TREATMENT  Acute bronchitis usually goes away in a couple weeks. Oftentimes, no medical treatment is necessary. Medicines are sometimes given for relief of fever or cough. Antibiotic medicines are usually not needed but may be prescribed in certain situations. In some cases, an inhaler may be recommended to help reduce shortness of breath and control the cough. A cool mist vaporizer may also be used to help thin bronchial secretions and make it easier to clear the chest.  HOME CARE INSTRUCTIONS  Get plenty of rest.   Drink enough fluids to keep your urine clear or pale yellow (unless you have a medical condition that requires fluid  restriction). Increasing fluids may help thin your respiratory secretions (sputum) and reduce chest congestion, and it will prevent dehydration.   Take medicines only as directed by your health care provider.  If you were prescribed an antibiotic medicine, finish it all even if you start to feel better.  Avoid smoking and secondhand smoke. Exposure to cigarette smoke or irritating chemicals will make bronchitis worse. If you are a smoker, consider using nicotine gum or skin patches to help control withdrawal symptoms. Quitting smoking will help your lungs heal faster.   Reduce the chances of another bout of acute bronchitis by washing your hands frequently, avoiding people with cold symptoms, and trying not to touch your hands to your mouth, nose, or eyes.   Keep all follow-up visits as directed by your health care provider.  SEEK MEDICAL CARE IF: Your symptoms do not improve after 1 week of treatment.  SEEK IMMEDIATE MEDICAL CARE IF:  You develop an increased fever or chills.   You have chest pain.   You have severe shortness of breath.  You have bloody sputum.   You develop dehydration.  You faint or repeatedly feel like you are going to pass out.  You develop repeated vomiting.  You develop a severe headache. MAKE SURE YOU:   Understand these instructions.  Will watch your condition.  Will get help right away if you are not doing well or get worse.   This information is not intended to replace advice given to you by your health care provider. Make sure you discuss any questions you have with  your health care provider.   Document Released: 09/20/2004 Document Revised: 09/03/2014 Document Reviewed: 02/03/2013 Elsevier Interactive Patient Education [employer].

## 2015-09-19 NOTE — Assessment & Plan Note (Signed)
Treat with prednisone Tessalon Atrovent nasal spray and azithromycin if not better. Obtain chest x-ray today.

## 2015-09-19 NOTE — Progress Notes (Signed)
       Cynthia Jennings is a 30 y.o. female who presents to [facility]: Primary Care today for cough and congestion. Patient was seen in your he fourth for cough and congestion. She was thought to have bronchitis. She was treated with Atrovent nasal spray and Tessalon Perles. She was given prednisone and azithromycin to use if not better. She notes that she improved but then worsened again about 4 days ago. She notes cough congestion without fevers chills nausea vomiting or diarrhea. She has not tried any of all her medicines yet. She feels well otherwise.   Past Medical History  Diagnosis Date  . Hypothyroid    Past Surgical History  Procedure Laterality Date  . Tonsillectomy  1996  . Ankle surgery  2001  . Knee surgery  2006  . Appendectomy  2011  . Cholecystectomy     Social History  Substance Use Topics  . Smoking status: Never Smoker   . Smokeless tobacco: Not on file  . Alcohol Use: 0.0 oz/week    0 Standard drinks or equivalent per week     Comment: occasional   family history is not on file.  ROS as above Medications: Current Outpatient Prescriptions  Medication Sig Dispense Refill  . benzonatate (TESSALON) 200 MG capsule Take 1 capsule (200 mg total) by mouth 3 (three) times daily as needed for cough. 45 capsule 0  . cetirizine (ZYRTEC) 10 MG tablet Take 10 mg by mouth.    . EPINEPHrine (EPIPEN 2-PAK) 0.3 mg/0.3 mL IJ SOAJ injection Inject into the muscle.    . fluticasone (FLONASE) 50 MCG/ACT nasal spray Place into the nose.    Marland Kitchen ipratropium (ATROVENT) 0.06 % nasal spray Place 2 sprays into both nostrils 4 (four) times daily. 15 mL 1  . levothyroxine (SYNTHROID, LEVOTHROID) 75 MCG tablet Take 1 tablet (75 mcg total) by mouth daily before breakfast. 90 tablet 1  . Multiple Vitamin (MULTIVITAMIN) capsule Take 1 capsule by mouth.    . Omega-3 1000 MG CAPS Take 1 g by mouth.    . WHEY PROTEIN  PO Take 26 g/day by mouth.    Marland Kitchen azithromycin (ZITHROMAX) 250 MG tablet Take 1 tablet (250 mg total) by mouth daily. Take first 2 tablets together, then 1 every day until finished. 6 tablet 0  . predniSONE (DELTASONE) 10 MG tablet Take 3 tablets (30 mg total) by mouth daily. 15 tablet 0   No current facility-administered medications for this visit.   Allergies  Allergen Reactions  . Sulfites Anaphylaxis  . Codeine Nausea And Vomiting  . Keflex [Cephalexin]     unsure  . Sulfa Antibiotics     unknown  . Sulfamethoxazole Hives     Exam:  BP 118/74 mmHg  Pulse 89  Temp(Src) 98.3 F (36.8 C)  Wt 166 lb (75.297 kg)  SpO2 100% Gen: Well NAD nontoxic HEENT: EOMI,  MMM clear nasal discharge. Normal tympanic membranes bilaterally. Posterior pharynx with cobblestoning. Lungs: Normal work of breathing. Coarse breath sounds Heart: RRR no MRG Abd: NABS, Soft. Nondistended, Nontender Exts: Brisk capillary refill, warm and well perfused.   No results found for this or any previous visit (from the past 24 hour(s)). No results found.   Please see individual assessment and plan sections.

## 2015-09-20 NOTE — Progress Notes (Signed)
Quick Note:  Normal, no changes. ______ 

## 2015-11-18 ENCOUNTER — Ambulatory Visit (INDEPENDENT_AMBULATORY_CARE_PROVIDER_SITE_OTHER): Payer: BC Managed Care – PPO | Admitting: Family Medicine

## 2015-11-18 ENCOUNTER — Encounter: Payer: Self-pay | Admitting: Family Medicine

## 2015-11-18 VITALS — BP 120/78 | HR 70 | Wt 160.0 lb

## 2015-11-18 DIAGNOSIS — E039 Hypothyroidism, unspecified: Secondary | ICD-10-CM

## 2015-11-18 LAB — TSH: TSH: 3.28 mIU/L

## 2015-11-18 NOTE — Progress Notes (Signed)
CC: Cynthia Jennings is a 30 y.o. female is here for Hypothyroidism   Subjective: HPI:  F/U hypothyroidism: Felt fatigued, cold, sluggish for three weeks in January and she decided to restart taking 100 MCG's of levothyroxine daily. Within a week she felt back to her normal Oliger and she continues to feel "great ". She has no complaints today. She denies unintentional weight loss or gain. She denies any skin or hair complaints. No gastrointestinal complaints.   Review Of Systems Outlined In HPI  Past Medical History  Diagnosis Date  . Hypothyroid     Past Surgical History  Procedure Laterality Date  . Tonsillectomy  1996  . Ankle surgery  2001  . Knee surgery  2006  . Appendectomy  2011  . Cholecystectomy     No family history on file.  Social History   Social History  . Marital Status: Single    Spouse Name: N/A  . Number of Children: N/A  . Years of Education: N/A   Occupational History  . Not on file.   Social History Main Topics  . Smoking status: Never Smoker   . Smokeless tobacco: Not on file  . Alcohol Use: 0.0 oz/week    0 Standard drinks or equivalent per week     Comment: occasional  . Drug Use: No  . Sexual Activity:    Partners: Female   Other Topics Concern  . Not on file   Social History Narrative     Objective: BP 120/78 mmHg  Pulse 70  Wt 160 lb (72.576 kg)  General: Alert and Oriented, No Acute Distress HEENT: Pupils equal, round, reactive to light. Conjunctivae clear.  Moist mucous membranes Lungs: Clear to auscultation bilaterally, no wheezing/ronchi/rales.  Comfortable work of breathing. Good air movement. Cardiac: Regular rate and rhythm. Normal S1/S2.  No murmurs, rubs, nor gallops.   Extremities: No peripheral edema.  Strong peripheral pulses.  Mental Status: No depression, anxiety, nor agitation. Skin: Warm and dry.  Assessment & Plan: Tresa EndoKelly was seen today for hypothyroidism.  Diagnoses and all orders for this  visit:  Hypothyroidism, unspecified hypothyroidism type -     TSH   Hypothyroidism: Clinically controlled she will have a TSH checked today and can continue on 100 MCG's daily if TSH is normal today.  Return in about 6 months (around 05/20/2016) for Thyroid.

## 2015-11-21 ENCOUNTER — Telehealth: Payer: Self-pay | Admitting: Family Medicine

## 2015-11-21 DIAGNOSIS — E039 Hypothyroidism, unspecified: Secondary | ICD-10-CM

## 2015-11-21 MED ORDER — LEVOTHYROXINE SODIUM 100 MCG PO TABS: 100.0000 ug | ORAL_TABLET | Freq: Every day | ORAL | Status: DC

## 2015-11-21 NOTE — Telephone Encounter (Signed)
Results left on vm.  Pt advised to call with any questions.

## 2015-11-21 NOTE — Telephone Encounter (Signed)
Will you please let patient know that her thyroid supplement appears to be adequately dosed therefore I've sent refills of the formulation to her [facility] on [address].  Can you also please mail her a copy of her result.

## 2016-04-11 ENCOUNTER — Encounter: Payer: Self-pay | Admitting: Family Medicine

## 2016-04-11 ENCOUNTER — Ambulatory Visit (INDEPENDENT_AMBULATORY_CARE_PROVIDER_SITE_OTHER): Payer: BC Managed Care – PPO | Admitting: Family Medicine

## 2016-04-11 VITALS — BP 117/83 | HR 71 | Temp 98.2°F | Wt 162.3 lb

## 2016-04-11 DIAGNOSIS — E039 Hypothyroidism, unspecified: Secondary | ICD-10-CM | POA: Diagnosis not present

## 2016-04-11 DIAGNOSIS — Z91018 Allergy to other foods: Secondary | ICD-10-CM | POA: Diagnosis not present

## 2016-04-11 DIAGNOSIS — H6091 Unspecified otitis externa, right ear: Secondary | ICD-10-CM | POA: Diagnosis not present

## 2016-04-11 DIAGNOSIS — Z8719 Personal history of other diseases of the digestive system: Secondary | ICD-10-CM

## 2016-04-11 LAB — TSH: TSH: 3.32 m[IU]/L

## 2016-04-11 MED ORDER — CIPROFLOXACIN-DEXAMETHASONE 0.3-0.1 % OT SUSP: 4.0000 [drp] | Freq: Two times a day (BID) | OTIC | 0 refills | Status: DC

## 2016-04-11 MED ORDER — AZITHROMYCIN 250 MG PO TABS: 250.0000 mg | ORAL_TABLET | Freq: Every day | ORAL | 0 refills | Status: DC

## 2016-04-11 MED ORDER — EPINEPHRINE 0.3 MG/0.3ML IJ SOAJ: 0.3000 mg | Freq: Once | INTRAMUSCULAR | 1 refills | Status: AC

## 2016-04-11 MED ORDER — LEVOTHYROXINE SODIUM 100 MCG PO TABS: 100.0000 ug | ORAL_TABLET | Freq: Every day | ORAL | 1 refills | Status: DC

## 2016-04-11 NOTE — Progress Notes (Signed)
Pt here for right ear pain.  Started Sunday evening.  Has used tylenol but has only helped pain slightly.

## 2016-04-11 NOTE — Progress Notes (Signed)
Cynthia Jennings is a 30 y.o. female who [name] Sports Medicine today for right ear pain. Patient notes a 4 day history of right ear pain worse with chewing and ear motion. She's tried some over-the-counter eardrops which have not helped. She swims weekly. She notes her current symptoms are consistent with previous episodes of otitis externa.  Hypothyroidism: Patient is a history of hypothyroidism currently doing well with levothyroxine 100 g daily. She is asymptomatic.  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 She has an expired EpiPen to use as needed. She has never had a use the EpiPen.   Past Medical History:  Diagnosis Date  . Hypothyroid    Past Surgical History:  Procedure Laterality Date  . ANKLE SURGERY  2001  . APPENDECTOMY  2011  . CHOLECYSTECTOMY    . KNEE SURGERY  2006  . TONSILLECTOMY  1996   Social History  Substance Use Topics  . Smoking status: Never Smoker  . Smokeless tobacco: Not on file  . Alcohol use 0.0 oz/week     Comment: occasional   family history is not on file.  ROS as above:  Medications: Current Outpatient Prescriptions  Medication Sig Dispense Refill  . azithromycin (ZITHROMAX) 250 MG tablet Take 1 tablet (250 mg total) by mouth daily. Take first 2 tablets together, then 1 every day until finished. 6 tablet 0  . cetirizine (ZYRTEC) 10 MG tablet Take 10 mg by mouth.    . ciprofloxacin-dexamethasone (CIPRODEX) otic suspension Place 4 drops into the right ear 2 (two) times daily. 1 mL 0  . EPINEPHrine 0.3 mg/0.3 mL IJ SOAJ injection Inject 0.3 mLs (0.3 mg total) into the muscle once. 2 Device 1  . levothyroxine (SYNTHROID, LEVOTHROID) 100 MCG tablet Take 1 tablet (100 mcg total) by mouth daily before breakfast. 90 tablet 1  . Multiple Vitamin (MULTIVITAMIN) capsule Take 1 capsule by mouth.    . Omega-3 1000 MG  CAPS Take 1 g by mouth.    . WHEY PROTEIN PO Take 26 g/day by mouth.     No current facility-administered medications for this visit.    Allergies  Allergen Reactions  . Sulfites Anaphylaxis  . Codeine Nausea And Vomiting  . Keflex [Cephalexin]     unsure  . Sulfa Antibiotics     unknown  . Sulfamethoxazole Hives     Exam:  BP 117/83 (BP Location: Left Arm, Patient Position: Sitting, Cuff Size: Normal)   Pulse 71   Temp 98.2 F (36.8 C) (Oral)   Wt 162 lb 4.8 oz (73.6 kg)   SpO2 100%   BMI 25.42 kg/m  Gen: Well NAD Nontoxic appearing HEENT: EOMI,  MMM right ear normal external ear. Ear canal erythematous. Normal tympanic membrane. Nontender. Left ear is normal. No goiter palpated Lungs: Normal work of breathing. CTABL Heart: RRR no MRG Abd: NABS, Soft. Nondistended, Nontender Exts: Brisk capillary refill, warm and well perfused.   No results found for this or any previous visit (from the past 24 hour(s)). No results found.    Assessment and Plan: 30 y.o. female with  1) new right otitis externa. Treat with Ciprodex. Unfortunately the cheaper generic Cortisporin drops contain sulfites and we cannot use them. 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 TSH    Discussed warning signs  or symptoms. Please see discharge instructions. Patient expresses understanding.

## 2016-04-11 NOTE — Patient Instructions (Signed)
Thank you for coming in today. Ear pain: Use eardrops twice daily. Get thyroid labs today Use EpiPen as needed  Use backup oral antibiotic if not getting better.  Call or go to the emergency room if you get worse, have trouble breathing, have chest pains, or palpitations.    Otitis Externa Otitis externa is a bacterial or fungal infection of the outer ear canal. This is the area from the eardrum to the outside of the ear. Otitis externa is sometimes called "swimmer's ear." CAUSES  Possible causes of infection include:  Swimming in dirty water.  Moisture remaining in the ear after swimming or bathing.  Mild injury (trauma) to the ear.  Objects stuck in the ear (foreign body).  Cuts or scrapes (abrasions) on the outside of the ear. SIGNS AND SYMPTOMS  The first symptom of infection is often itching in the ear canal. Later signs and symptoms may include swelling and redness of the ear canal, ear pain, and yellowish-white fluid (pus) coming from the ear. The ear pain may be worse when pulling on the earlobe. DIAGNOSIS  Your health care provider will perform a physical exam. A sample of fluid may be taken from the ear and examined for bacteria or fungi. TREATMENT  Antibiotic ear drops are often given for 10 to 14 days. Treatment may also include pain medicine or corticosteroids to reduce itching and swelling. HOME CARE INSTRUCTIONS   Apply antibiotic ear drops to the ear canal as prescribed by your health care provider.  Take medicines only as directed by your health care provider.  If you have diabetes, follow any additional treatment instructions from your health care provider.  Keep all follow-up visits as directed by your health care provider. PREVENTION   Keep your ear dry. Use the corner of a towel to absorb water out of the ear canal after swimming or bathing.  Avoid scratching or putting objects inside your ear. This can damage the ear canal or remove the protective wax  that lines the canal. This makes it easier for bacteria and fungi to grow.  Avoid swimming in lakes, polluted water, or poorly chlorinated pools.  You may use ear drops made of rubbing alcohol and vinegar after swimming. Combine equal parts of white vinegar and alcohol in a bottle. Put 3 or 4 drops into each ear after swimming. SEEK MEDICAL CARE IF:   You have a fever.  Your ear is still red, swollen, painful, or draining pus after 3 days.  Your redness, swelling, or pain gets worse.  You have a severe headache.  You have redness, swelling, pain, or tenderness in the area behind your ear. MAKE SURE YOU:   Understand these instructions.  Will watch your condition.  Will get help right away if you are not doing well or get worse.   This information is not intended to replace advice given to you by your health care provider. Make sure you discuss any questions you have with your health care provider.   Document Released: 08/13/2005 Document Revised: 09/03/2014 Document Reviewed: 08/30/2011 Elsevier Interactive Patient Education [employer]

## 2016-07-17 ENCOUNTER — Ambulatory Visit (INDEPENDENT_AMBULATORY_CARE_PROVIDER_SITE_OTHER): Payer: BC Managed Care – PPO | Admitting: Family Medicine

## 2016-07-17 VITALS — BP 110/72 | HR 80 | Temp 98.1°F | Wt 157.0 lb

## 2016-07-17 DIAGNOSIS — H6691 Otitis media, unspecified, right ear: Secondary | ICD-10-CM

## 2016-07-17 DIAGNOSIS — M7021 Olecranon bursitis, right elbow: Secondary | ICD-10-CM | POA: Insufficient documentation

## 2016-07-17 MED ORDER — PREDNISONE 10 MG PO TABS: 30.0000 mg | ORAL_TABLET | Freq: Every day | ORAL | 0 refills | Status: DC

## 2016-07-17 MED ORDER — CIPROFLOXACIN-DEXAMETHASONE 0.3-0.1 % OT SUSP: 4.0000 [drp] | Freq: Two times a day (BID) | OTIC | 1 refills | Status: AC

## 2016-07-17 NOTE — Patient Instructions (Addendum)
Thank you for coming in today. Use the drops.  Continue OTC medications.  Take prednisone if not better.    Barotitis Media Barotitis media is inflammation of your middle ear. This occurs when the auditory tube (eustachian tube) leading from the back of your nose (nasopharynx) to your eardrum is blocked. This blockage may result from a cold, environmental allergies, or an upper respiratory infection. Unresolved barotitis media may lead to damage or hearing loss (barotrauma), which may become permanent. HOME CARE INSTRUCTIONS   Use medicines as recommended by your health care provider. Over-the-counter medicines will help unblock the canal and can help during times of air travel.  Do not put anything into your ears to clean or unplug them. Eardrops will not be helpful.  Do not swim, dive, or fly until your health care provider says it is all right to do so. If these activities are necessary, chewing gum with frequent, forceful swallowing may help. It is also helpful to hold your nose and gently blow to pop your ears for equalizing pressure changes. This forces air into the eustachian tube.  Only take over-the-counter or prescription medicines for pain, discomfort, or fever as directed by your health care provider.  A decongestant may be helpful in decongesting the middle ear and make pressure equalization easier. SEEK MEDICAL CARE IF:  You experience a serious form of dizziness in which you feel as if the room is spinning and you feel nauseated (vertigo).  Your symptoms only involve one ear. SEEK IMMEDIATE MEDICAL CARE IF:   You develop a severe headache, dizziness, or severe ear pain.  You have bloody or pus-like drainage from your ears.  You develop a fever.  Your problems do not improve or become worse. MAKE SURE YOU:   Understand these instructions.  Will watch your condition.  Will get help right away if you are not doing well or get worse. This information is not intended  to replace advice given to you by your health care provider. Make sure you discuss any questions you have with your health care provider. Document Released: 08/10/2000 Document Revised: 06/03/2013 Document Reviewed: 03/10/2013 Elsevier Interactive Patient Education  2017 [employer]

## 2016-07-17 NOTE — Progress Notes (Signed)
       Cynthia Jennings is a 30 y.o. female who [name] today for right ear pain. Patient is a 3 week history of right ear pain and pressure. She's tried Zyrtec Flonase nasal spray and Tylenol sinus which have helped only a little. She notes no change in hearing fevers or chills. She has not tried using any eardrops yet. In the past she's done well for otitis externa with Ciprodex eardrops.   Past Medical History:  Diagnosis Date  . Hypothyroid    Past Surgical History:  Procedure Laterality Date  . ANKLE SURGERY  2001  . APPENDECTOMY  2011  . CHOLECYSTECTOMY    . KNEE SURGERY  2006  . TONSILLECTOMY  1996   Social History  Substance Use Topics  . Smoking status: Never Smoker  . Smokeless tobacco: Not on file  . Alcohol use 0.0 oz/week     Comment: occasional   family history is not on file.  ROS as above:  Medications: Current Outpatient Prescriptions  Medication Sig Dispense Refill  . cetirizine (ZYRTEC) 10 MG tablet Take 10 mg by mouth.    . ciprofloxacin-dexamethasone (CIPRODEX) otic suspension Place 4 drops into the right ear 2 (two) times daily. 1 mL 1  . levothyroxine (SYNTHROID, LEVOTHROID) 100 MCG tablet Take 1 tablet (100 mcg total) by mouth daily before breakfast. 90 tablet 1  . Multiple Vitamin (MULTIVITAMIN) capsule Take 1 capsule by mouth.    . Omega-3 1000 MG CAPS Take 1 g by mouth.    . predniSONE (DELTASONE) 10 MG tablet Take 3 tablets (30 mg total) by mouth daily with breakfast. 15 tablet 0  . WHEY PROTEIN PO Take 26 g/day by mouth.     No current facility-administered medications for this visit.    Allergies  Allergen Reactions  . Sulfites Anaphylaxis  . Codeine Nausea And Vomiting  . Keflex [Cephalexin]     unsure  . Sulfa Antibiotics     unknown  . Sulfamethoxazole Hives    Health Maintenance Health Maintenance  Topic  Date Due  . INFLUENZA VACCINE  03/27/2016  . PAP SMEAR  12/25/2017  . TETANUS/TDAP  02/18/2020  . HIV Screening  Completed     Exam:  BP 110/72   Pulse 80   Temp 98.1 F (36.7 C) (Oral)   Wt 157 lb (71.2 kg)   SpO2 99%   BMI 24.59 kg/m  Gen: Well NAD HEENT: EOMI,  MMM Right ear canal is mildly irritated. The tympanic membrane appears to be retracted but non-erythematous with no effusion. Pain with ear motion. Mastoids are nontender bilaterally. Left ear canal and tympanic membranes normal appearing Lungs: Normal work of breathing. CTABL Heart: RRR no MRG Abd: NABS, Soft. Nondistended, Nontender Exts: Brisk capillary refill, warm and well perfused.    No results found for this or any previous visit (from the past 72 hour(s)). No results found.    Assessment and Plan: 30 y.o. female with otitis externa and retracted eardrum. Likely eustachian tube dysfunction. Treat with Ciprodex eardrops if not better use prednisone. Continue over-the-counter medications.   No orders of the defined types were placed in this encounter.   Discussed warning signs or symptoms. Please see discharge instructions. Patient expresses understanding.

## 2016-07-24 ENCOUNTER — Telehealth: Payer: Self-pay

## 2016-07-24 DIAGNOSIS — H9202 Otalgia, left ear: Secondary | ICD-10-CM

## 2016-07-24 NOTE — Telephone Encounter (Signed)
I think ultimately if the pain continues and he can get a better to good idea to see an ear nose and throat specialist. I'll put a referral in.  If patient cannot wait until she can be seen by her nose and throat she can come in and schedule an appointment with me as soon as she likes

## 2016-07-24 NOTE — Telephone Encounter (Signed)
Notified patient.

## 2016-08-16 ENCOUNTER — Ambulatory Visit: Payer: BC Managed Care – PPO | Admitting: Family Medicine

## 2016-09-28 ENCOUNTER — Telehealth: Payer: Self-pay | Admitting: Family Medicine

## 2016-09-28 DIAGNOSIS — H9209 Otalgia, unspecified ear: Secondary | ICD-10-CM

## 2016-09-28 NOTE — Telephone Encounter (Signed)
Dr. Mirian Capuchinorey   Cynthia Jennings called and stated that she went to see Dr. Haroldine Lawsrossley and did not care for him she also stated that she had to go back to a doctor in [place] for her ear pain after seeing him. She would like to have another referral placed to get a second opinion. - CF

## 2016-10-01 NOTE — Telephone Encounter (Signed)
Will place second referral.

## 2016-10-02 NOTE — Telephone Encounter (Signed)
This was taken care of last week per Lenise Heraldindy Moore.

## 2016-10-05 DIAGNOSIS — H608X1 Other otitis externa, right ear: Secondary | ICD-10-CM | POA: Insufficient documentation

## 2016-10-05 DIAGNOSIS — H9011 Conductive hearing loss, unilateral, right ear, with unrestricted hearing on the contralateral side: Secondary | ICD-10-CM | POA: Insufficient documentation

## 2017-02-19 ENCOUNTER — Ambulatory Visit (INDEPENDENT_AMBULATORY_CARE_PROVIDER_SITE_OTHER): Payer: BC Managed Care – PPO | Admitting: Family Medicine

## 2017-02-19 ENCOUNTER — Encounter: Payer: Self-pay | Admitting: Family Medicine

## 2017-02-19 VITALS — BP 113/77 | HR 70 | Ht 66.0 in | Wt 166.0 lb

## 2017-02-19 DIAGNOSIS — E039 Hypothyroidism, unspecified: Secondary | ICD-10-CM | POA: Diagnosis not present

## 2017-02-19 DIAGNOSIS — D649 Anemia, unspecified: Secondary | ICD-10-CM | POA: Insufficient documentation

## 2017-02-19 DIAGNOSIS — J301 Allergic rhinitis due to pollen: Secondary | ICD-10-CM

## 2017-02-19 DIAGNOSIS — Z8719 Personal history of other diseases of the digestive system: Secondary | ICD-10-CM | POA: Diagnosis not present

## 2017-02-19 LAB — IRON AND TIBC
%SAT: 47 % (ref 11–50)
IRON: 190 ug/dL (ref 40–190)
TIBC: 401 ug/dL (ref 250–450)
UIBC: 211 ug/dL

## 2017-02-19 LAB — CBC
HEMATOCRIT: 37 % (ref 35.0–45.0)
HEMOGLOBIN: 12 g/dL (ref 11.7–15.5)
MCH: 27.6 pg (ref 27.0–33.0)
MCHC: 32.4 g/dL (ref 32.0–36.0)
MCV: 85.1 fL (ref 80.0–100.0)
MPV: 9.1 fL (ref 7.5–12.5)
Platelets: 332 10*3/uL (ref 140–400)
RBC: 4.35 MIL/uL (ref 3.80–5.10)
RDW: 13.6 % (ref 11.0–15.0)
WBC: 5.4 10*3/uL (ref 3.8–10.8)

## 2017-02-19 LAB — TSH: TSH: 5.82 mIU/L — ABNORMAL HIGH

## 2017-02-19 MED ORDER — LEVOCETIRIZINE DIHYDROCHLORIDE 5 MG PO TABS: 5.0000 mg | ORAL_TABLET | Freq: Every evening | ORAL | 12 refills | Status: DC

## 2017-02-19 NOTE — Patient Instructions (Addendum)
Thank you for coming in today. Continue xyzal  We will get labs today.  Recheck in 1 year or sooner if needed.    Iron Deficiency Anemia, Adult Iron deficiency anemia is a condition in which the concentration of red blood cells or hemoglobin in the blood is below normal because of too little iron. Hemoglobin is a substance in red blood cells that carries oxygen to the body's tissues. When the concentration of red blood cells or hemoglobin is too low, not enough oxygen reaches these tissues. Iron deficiency anemia is usually long-lasting (chronic) and it develops over time. It may or may not cause symptoms. It is a common type of anemia. What are the causes? This condition may be caused by:  Not enough iron in the diet.  Blood loss caused by bleeding in the intestine.  Blood loss from a gastrointestinal condition like Crohn disease.  Frequent blood draws, such as from blood donation.  Abnormal absorption in the gut.  Heavy menstrual periods in women.  Cancers of the gastrointestinal system, such as colon cancer.  What are the signs or symptoms? Symptoms of this condition may include:  Fatigue.  Headache.  Pale skin, lips, and nail beds.  Poor appetite.  Weakness.  Shortness of breath.  Dizziness.  Cold hands and feet.  Fast or irregular heartbeat.  Irritability. This is more common in severe anemia.  Rapid breathing. This is more common in severe anemia.  Mild anemia may not cause any symptoms. How is this diagnosed? This condition is diagnosed based on:  Your medical history.  A physical exam.  Blood tests.  You may have additional tests to find the underlying cause of your anemia, such as:  Testing for blood in the stool (fecal occult blood test).  A procedure to see inside your colon and rectum (colonoscopy).  A procedure to see inside your esophagus and stomach (endoscopy).  A test in which cells are removed from bone marrow (bone marrow  aspiration) or fluid is removed from the bone marrow to be examined (biopsy). This is rarely needed.  How is this treated? This condition is treated by correcting the cause of your iron deficiency. Treatment may involve:  Adding iron-rich foods to your diet.  Taking iron supplements. If you are pregnant or breastfeeding, you may need to take extra iron because your normal diet usually does not provide the amount of iron that you need.  Increasing vitamin C intake. Vitamin C helps your body absorb iron. Your health care provider may recommend that you take iron supplements along with a glass of orange juice or a vitamin C supplement.  Medicines to make heavy menstrual flow lighter.  Surgery.  You may need repeat blood tests to determine whether treatment is working. Depending on the underlying cause, the anemia should be corrected within 2 months of starting treatment. If the treatment does not seem to be working, you may need more testing. Follow these instructions at home: Medicines  Take over-the-counter and prescription medicines only as told by your health care provider. This includes iron supplements and vitamins.  If you cannot tolerate taking iron supplements by mouth, talk with your health care provider about taking them through a vein (intravenously) or an injection into a muscle.  For the best iron absorption, you should take iron supplements when your stomach is empty. If you cannot tolerate them on an empty stomach, you may need to take them with food.  Do not drink milk or take antacids at  the same time as your iron supplements. Milk and antacids may interfere with iron absorption.  Iron supplements can cause constipation. To prevent constipation, include fiber in your diet as told by your health care provider. A stool softener may also be recommended. Eating and drinking  Talk with your health care provider before changing your diet. He or she may recommend that you eat  foods that contain a lot of iron, such as: ? Liver. ? Low-fat (lean) beef. ? Breads and cereals that have iron added to them (are fortified). ? Eggs. ? Dried fruit. ? Dark green, leafy vegetables.  To help your body use the iron from iron-rich foods, eat those foods at the same time as fresh fruits and vegetables that are high in vitamin C. Foods that are high in vitamin C include: ? Oranges. ? Peppers. ? Tomatoes. ? Mangoes.  Drinkenoughfluid to keep your urine clear or pale yellow. General instructions  Return to your normal activities as told by your health care provider. Ask your health care provider what activities are safe for you.  Practice good hygiene. Anemia can make you more prone to illness and infection.  Keep all follow-up visits as told by your health care provider. This is important. Contact a health care provider if:  You feel nauseous or you vomit.  You feel weak.  You have unexplained sweating.  You develop symptoms of constipation, such as: ? Having fewer than three bowel movements a week. ? Straining to have a bowel movement. ? Having stools that are hard, dry, or larger than normal. ? Feeling full or bloated. ? Pain in the lower abdomen. ? Not feeling relief after having a bowel movement. Get help right away if:  You faint. If this happens, do not drive yourself to the hospital. Call your local emergency services (911 in the [place]  You have chest pain.  You have shortness of breath that: ? Is severe. ? Gets worse with physical activity.  You have a rapid heartbeat.  You become light-headed when getting up from a sitting or lying down position. This information is not intended to replace advice given to you by your health care provider. Make sure you discuss any questions you have with your health care provider. Document Released: 08/10/2000 Document Revised: 05/02/2016 Document Reviewed: 05/02/2016 Elsevier Interactive Patient Education   2018 [employer]

## 2017-02-19 NOTE — Progress Notes (Signed)
Cynthia Jennings is a 31 y.o. female who presents to [name]: [facility] today for follow-up of hypothyroidism. Patient takes levothyroxine 100 g daily. She feels well with no symptoms such as feeling too hot or too cold hair or skin changes. She denies fevers chills nausea vomiting or diarrhea.  She notes that she has a history of GI bleed and iron deficiency anemia. This was last evaluated over a year ago. She denies any lightheadedness or dizziness and notes regular menstrual cycles.  Lastly she notes seasonal allergy symptoms. She takes levo cetirizine 5 mg daily which seems to work well.   Past Medical History:  Diagnosis Date  . Hypothyroid    Past Surgical History:  Procedure Laterality Date  . ANKLE SURGERY  2001  . APPENDECTOMY  2011  . CHOLECYSTECTOMY    . KNEE SURGERY  2006  . TONSILLECTOMY  1996   Social History  Substance Use Topics  . Smoking status: Never Smoker  . Smokeless tobacco: Never Used  . Alcohol use 0.0 oz/week     Comment: occasional   family history is not on file.  ROS as above:  Medications: Current Outpatient Prescriptions  Medication Sig Dispense Refill  . levothyroxine (SYNTHROID, LEVOTHROID) 100 MCG tablet Take 1 tablet (100 mcg total) by mouth daily before breakfast. 90 tablet 1  . Multiple Vitamin (MULTIVITAMIN) capsule Take 1 capsule by mouth.    . Omega-3 1000 MG CAPS Take 1 g by mouth.    . WHEY PROTEIN PO Take 26 g/day by mouth.    . levocetirizine (XYZAL) 5 MG tablet Take 1 tablet (5 mg total) by mouth every evening. 30 tablet 12   No current facility-administered medications for this visit.    Allergies  Allergen Reactions  . Sulfites Anaphylaxis  . Codeine Nausea And Vomiting  . Keflex [Cephalexin]     unsure  . Sulfa Antibiotics     unknown  . Sulfamethoxazole Hives    Health Maintenance Health Maintenance    Topic Date Due  . INFLUENZA VACCINE  03/27/2017  . PAP SMEAR  12/25/2017  . TETANUS/TDAP  02/18/2020  . HIV Screening  Completed     Exam:  BP 113/77   Pulse 70   Ht 5\' 6"  (1.676 m)   Wt 166 lb (75.3 kg)   BMI 26.79 kg/m   Wt Readings from Last 10 Encounters:  02/19/17 166 lb (75.3 kg)  07/17/16 157 lb (71.2 kg)  04/11/16 162 lb 4.8 oz (73.6 kg)  11/18/15 160 lb (72.6 kg)  09/19/15 166 lb (75.3 kg)  08/31/15 162 lb (73.5 kg)  03/24/15 154 lb (69.9 kg)  02/24/15 153 lb (69.4 kg)  08/31/14 143 lb (64.9 kg)  08/10/14 144 lb (65.3 kg)    Gen: Well NAD HEENT: EOMI,  MMM No goiter no nasal discharge. Lungs: Normal work of breathing. CTABL Heart: RRR no MRG Abd: NABS, Soft. Nondistended, Nontender Exts: Brisk capillary refill, warm and well perfused.    No results found for this or any previous visit (from the past 72 hour(s)). No results found.    Assessment and Plan: 31 y.o. female with  Hypothyroidism: Recheck TSH if all is well will refill her thyroxine.  Iron deficiency anemia: Last hemoglobin 11.6. Recheck CBC ferritin and TIBC.  Seasonal allergies: Will prescribe levo cetirizine as this may be cheaper.   Orders Placed This Encounter  Procedures  . TSH  . CBC  .  Ferritin  . Iron and TIBC   Meds ordered this encounter  Medications  . levocetirizine (XYZAL) 5 MG tablet    Sig: Take 1 tablet (5 mg total) by mouth every evening.    Dispense:  30 tablet    Refill:  12     Discussed warning signs or symptoms. Please see discharge instructions. Patient expresses understanding.

## 2017-02-20 LAB — FERRITIN: Ferritin: 24 ng/mL (ref 10–154)

## 2017-02-20 MED ORDER — LEVOTHYROXINE SODIUM 112 MCG PO TABS: 112.0000 ug | ORAL_TABLET | Freq: Every day | ORAL | 1 refills | Status: DC

## 2017-02-20 NOTE — Addendum Note (Signed)
Addended by: Rodolph BongOREY, Perian Tedder S on: 02/20/2017 07:48 AM   Modules accepted: Orders

## 2017-07-22 ENCOUNTER — Ambulatory Visit: Payer: BC Managed Care – PPO | Admitting: Family Medicine

## 2017-07-22 ENCOUNTER — Encounter: Payer: Self-pay | Admitting: Family Medicine

## 2017-07-22 VITALS — BP 125/85 | HR 87 | Ht 67.0 in | Wt 168.0 lb

## 2017-07-22 DIAGNOSIS — H6521 Chronic serous otitis media, right ear: Secondary | ICD-10-CM

## 2017-07-22 DIAGNOSIS — E039 Hypothyroidism, unspecified: Secondary | ICD-10-CM

## 2017-07-22 NOTE — Progress Notes (Signed)
       Cynthia Jennings Alice[place] is a 31 y.o. female who [name] today for follow up thyroid.  Cynthia Jennings was seen a few months ago for hypothyroid. Her TSH is elevated and she was symptomatic feeling cold and fatigued. Her levothyroxine was increased and she felt a bit better. She notes over the last several months she is again noticed feeling cold and fatigued and is worried that her levothyroxine dose is too little.   Past Medical History:  Diagnosis Date  . Hypothyroid    Past Surgical History:  Procedure Laterality Date  . ANKLE SURGERY  2001  . APPENDECTOMY  2011  . CHOLECYSTECTOMY    . KNEE SURGERY  2006  . TONSILLECTOMY  1996   Social History   Tobacco Use  . Smoking status: Never Smoker  . Smokeless tobacco: Never Used  Substance Use Topics  . Alcohol use: Yes    Alcohol/week: 0.0 oz    Comment: occasional   family history is not on file.  ROS as above:  Medications: Current Outpatient Medications  Medication Sig Dispense Refill  . levocetirizine (XYZAL) 5 MG tablet Take 1 tablet (5 mg total) by mouth every evening. 30 tablet 12  . levothyroxine (SYNTHROID, LEVOTHROID) 112 MCG tablet Take 1 tablet (112 mcg total) by mouth daily before breakfast. 90 tablet 1  . Multiple Vitamin (MULTIVITAMIN) capsule Take 1 capsule by mouth.    . Omega-3 1000 MG CAPS Take 1 g by mouth.    . WHEY PROTEIN PO Take 26 g/day by mouth.     No current facility-administered medications for this visit.    Allergies  Allergen Reactions  . Sulfites Anaphylaxis  . Codeine Nausea And Vomiting  . Keflex [Cephalexin]     unsure  . Sulfa Antibiotics     unknown  . Sulfamethoxazole Hives    Health Maintenance Health Maintenance  Topic Date Due  . PAP SMEAR  12/25/2017  . TETANUS/TDAP  02/18/2020  . INFLUENZA VACCINE  Completed  . HIV Screening  Completed     Exam:  BP  125/85   Pulse 87   Ht 5\' 7"  (1.702 m)   Wt 168 lb (76.2 kg)   BMI 26.31 kg/m  Gen: Well NAD HEENT: EOMI,  MMM no goiter. Tympanic membranes are normal on the left and with slight clear serous effusion on the right Lungs: Normal work of breathing. CTABL Heart: RRR no MRG Abd: NABS, Soft. Nondistended, Nontender Exts: Brisk capillary refill, warm and well perfused.    No results found for this or any previous visit (from the past 72 hour(s)). No results found.    Assessment and Plan: 31 y.o. female with  Hypothyroidism. Plan to check thyroid related labs as well as metabolic panel and CBC. We'll adjust levothyroxine dose based on TSH.  Serous effusion overall doing pretty well. Plan for watchful waiting.   Orders Placed This Encounter  Procedures  . T4, free  . T3, free  . TSH  . CBC  . COMPLETE METABOLIC PANEL WITH GFR   No orders of the defined types were placed in this encounter.    Discussed warning signs or symptoms. Please see discharge instructions. Patient expresses understanding.

## 2017-07-22 NOTE — Patient Instructions (Signed)
Thank you for coming in today. Get labs today.  I will get results to you likely tomorrow.  We will adjust the thyroid medicine as needed.

## 2017-07-23 ENCOUNTER — Other Ambulatory Visit: Payer: Self-pay | Admitting: Family Medicine

## 2017-07-23 LAB — COMPLETE METABOLIC PANEL WITH GFR
AG Ratio: 1.7 (calc) (ref 1.0–2.5)
ALBUMIN MSPROF: 4.5 g/dL (ref 3.6–5.1)
ALKALINE PHOSPHATASE (APISO): 52 U/L (ref 33–115)
ALT: 13 U/L (ref 6–29)
AST: 20 U/L (ref 10–30)
BUN: 20 mg/dL (ref 7–25)
CALCIUM: 9.6 mg/dL (ref 8.6–10.2)
CO2: 28 mmol/L (ref 20–32)
CREATININE: 1.03 mg/dL (ref 0.50–1.10)
Chloride: 101 mmol/L (ref 98–110)
GFR, EST AFRICAN AMERICAN: 84 mL/min/{1.73_m2} (ref >=60)
GFR, EST NON AFRICAN AMERICAN: 73 mL/min/{1.73_m2} (ref >=60)
GLOBULIN: 2.6 g/dL (ref 1.9–3.7)
GLUCOSE: 89 mg/dL (ref 65–99)
Potassium: 4.2 mmol/L (ref 3.5–5.3)
SODIUM: 137 mmol/L (ref 135–146)
TOTAL PROTEIN: 7.1 g/dL (ref 6.1–8.1)
Total Bilirubin: 0.4 mg/dL (ref 0.2–1.2)

## 2017-07-23 LAB — T4, FREE: FREE T4: 1.3 ng/dL (ref 0.8–1.8)

## 2017-07-23 LAB — CBC
HCT: 35.5 % (ref 35.0–45.0)
HEMOGLOBIN: 11.8 g/dL (ref 11.7–15.5)
MCH: 27.3 pg (ref 27.0–33.0)
MCHC: 33.2 g/dL (ref 32.0–36.0)
MCV: 82.2 fL (ref 80.0–100.0)
MPV: 9.7 fL (ref 7.5–12.5)
PLATELETS: 358 10*3/uL (ref 140–400)
RBC: 4.32 10*6/uL (ref 3.80–5.10)
RDW: 12.5 % (ref 11.0–15.0)
WBC: 9.4 10*3/uL (ref 3.8–10.8)

## 2017-07-23 LAB — T3, FREE: T3, Free: 3.2 pg/mL (ref 2.3–4.2)

## 2017-07-23 LAB — TSH: TSH: 2.89 mIU/L

## 2017-07-23 MED ORDER — LEVOTHYROXINE SODIUM 112 MCG PO TABS: 112.0000 ug | ORAL_TABLET | Freq: Every day | ORAL | 1 refills | Status: DC

## 2017-09-04 ENCOUNTER — Ambulatory Visit: Payer: BC Managed Care – PPO | Admitting: Osteopathic Medicine

## 2017-09-04 ENCOUNTER — Encounter: Payer: Self-pay | Admitting: Osteopathic Medicine

## 2017-09-04 VITALS — BP 122/76 | HR 89 | Temp 98.0°F | Wt 170.1 lb

## 2017-09-04 DIAGNOSIS — J019 Acute sinusitis, unspecified: Secondary | ICD-10-CM

## 2017-09-04 MED ORDER — BENZONATATE 200 MG PO CAPS
200.0000 mg | ORAL_CAPSULE | Freq: Three times a day (TID) | ORAL | 0 refills | Status: DC | PRN
Start: 2017-09-04 — End: 2017-09-14

## 2017-09-04 MED ORDER — IPRATROPIUM BROMIDE 0.06 % NA SOLN: 2.0000 | Freq: Four times a day (QID) | NASAL | 1 refills | Status: DC

## 2017-09-04 MED ORDER — AMOXICILLIN-POT CLAVULANATE 875-125 MG PO TABS: 1.0000 | ORAL_TABLET | Freq: Two times a day (BID) | ORAL | 0 refills | Status: DC

## 2017-09-04 NOTE — Progress Notes (Signed)
HPI: Cynthia Jennings is a 32 y.o. female who  has a past medical history of Hypothyroid.  she presents to [facility] today, 09/04/17,  for chief complaint of:  Chief Complaint  Patient presents with  . Sinus Problem    Concern for sinus infection, sinus pressure with coughing more the past few days. Last week had similar sinus/coughing symptoms which resolved but now the pain is back, sinus pressure is worse on the right side with nasal congestion and ear fullness/pain.    Past medical, surgical, social and family history reviewed:  Patient Active Problem List   Diagnosis Date Noted  . Anemia 02/19/2017  . Seasonal allergic rhinitis due to pollen 02/19/2017  . Olecranon bursitis of right elbow 07/17/2016  . Food allergy 04/11/2016  . History of Bell's palsy 09/02/2014  . History of GI bleed 08/13/2014  . Hypothyroidism 08/10/2014    Past Surgical History:  Procedure Laterality Date  . ANKLE SURGERY  2001  . APPENDECTOMY  2011  . CHOLECYSTECTOMY    . KNEE SURGERY  2006  . TONSILLECTOMY  1996    Social History   Tobacco Use  . Smoking status: Never Smoker  . Smokeless tobacco: Never Used  Substance Use Topics  . Alcohol use: Yes    Alcohol/week: 0.0 oz    Comment: occasional   No family history on file.   Current medication list and allergy/intolerance information reviewed:    Current Outpatient Medications  Medication Sig Dispense Refill  . levocetirizine (XYZAL) 5 MG tablet Take 1 tablet (5 mg total) by mouth every evening. 30 tablet 12  . levothyroxine (SYNTHROID, LEVOTHROID) 112 MCG tablet Take 1 tablet (112 mcg total) by mouth daily before breakfast. 90 tablet 1  . Multiple Vitamin (MULTIVITAMIN) capsule Take 1 capsule by mouth.    . Omega-3 1000 MG CAPS Take 1 g by mouth.    . WHEY PROTEIN PO Take 26 g/day by mouth.     No current facility-administered medications for this visit.     Allergies  Allergen Reactions  .  Sodium Metabisulfite Shortness Of Breath and Swelling  . Sulfites Anaphylaxis  . Codeine Nausea And Vomiting  . Cephalexin Nausea Only    unsure Abdominal pain, nausea and fever   . Sulfamethoxazole Hives  . Sulfa Antibiotics Rash    unknown      Review of Systems:  Constitutional:  No  fever, no chills, +recent illness, No unintentional weight changes. +significant fatigue.   HEENT: No  headache, no vision change, no hearing change, No sore throat, +sinus pressure  Cardiac: No  chest pain, No  pressure, No palpitations, No  Orthopnea  Respiratory:  No  shortness of breath. +Cough  Gastrointestinal: No  abdominal pain, No  nausea,    Exam:  BP 122/76   Pulse 89   Temp 98 F (36.7 C) (Oral)   Wt 170 lb 1.9 oz (77.2 kg)   BMI 26.64 kg/m   Constitutional: VS see above. General Appearance: alert, well-developed, well-nourished, NAD  Eyes: Normal lids and conjunctive, non-icteric sclera  Ears, Nose, Mouth, Throat: MMM, Normal external inspection ears/nares/mouth/lips/gums. TM normal bilaterally. Pharynx/tonsils no erythema, no exudate. Nasal mucosa normal.   Neck: No masses, trachea midline. No thyroid enlargement. No tenderness/mass appreciated. No lymphadenopathy  Respiratory: Normal respiratory effort. no wheeze, no rhonchi, no rales  Cardiovascular: S1/S2 normal, no murmur, no rub/gallop auscultated. RRR.     ASSESSMENT/PLAN:   Acute sinusitis, recurrence not specified, unspecified  location   Meds ordered this encounter  Medications  . amoxicillin-clavulanate (AUGMENTIN) 875-125 MG tablet    Sig: Take 1 tablet by mouth 2 (two) times daily for 10 days.    Dispense:  14 tablet    Refill:  0  . ipratropium (ATROVENT) 0.06 % nasal spray    Sig: Place 2 sprays into both nostrils 4 (four) times daily.    Dispense:  15 mL    Refill:  1  . benzonatate (TESSALON) 200 MG capsule    Sig: Take 1 capsule (200 mg total) by mouth 3 (three) times daily as needed for  cough.    Dispense:  30 capsule    Refill:  0     Patient Instructions  Plan: Antibiotics for sinus infection Nasal spray to help decrease mucus production Cough medicine sent in as well If no better or if worse, please let us know    Visit summary with medication list and pertinent instructions was printed for patient to review. All questions at time of visit were answered - patient instructed to contact office with any additional concerns. ER/RTC precautions were reviewed with the patient.   Follow-up plan: Return if symptoms worsen or fail to improve.  Note: Total time spent 25 minutes, greater than 50% of the visit was spent face-to-face counseling and coordinating care for the following: The encounter diagnosis was Acute sinusitis, recurrence not specified, unspecified location..  Please note: voice recognition software was used to produce this document, and typos may escape review. Please contact Dr. Lyn Hollingshead for any needed clarifications.

## 2017-09-04 NOTE — Patient Instructions (Signed)
Plan: Antibiotics for sinus infection Nasal spray to help decrease mucus production Cough medicine sent in as well If no better or if worse, please let us know

## 2017-09-14 ENCOUNTER — Emergency Department (INDEPENDENT_AMBULATORY_CARE_PROVIDER_SITE_OTHER): Payer: BC Managed Care – PPO

## 2017-09-14 ENCOUNTER — Emergency Department (INDEPENDENT_AMBULATORY_CARE_PROVIDER_SITE_OTHER)
Admission: EM | Admit: 2017-09-14 | Discharge: 2017-09-14 | Disposition: A | Discharge status: Home / Self Care | Payer: BC Managed Care – PPO | Source: Home / Self Care | Attending: Family Medicine | Admitting: Family Medicine

## 2017-09-14 ENCOUNTER — Other Ambulatory Visit: Payer: Self-pay

## 2017-09-14 DIAGNOSIS — R079 Chest pain, unspecified: Secondary | ICD-10-CM

## 2017-09-14 DIAGNOSIS — R05 Cough: Secondary | ICD-10-CM | POA: Diagnosis not present

## 2017-09-14 DIAGNOSIS — R053 Chronic cough: Secondary | ICD-10-CM

## 2017-09-14 MED ORDER — AZITHROMYCIN 250 MG PO TABS: ORAL_TABLET | ORAL | 0 refills | Status: DC

## 2017-09-14 MED ORDER — HYDROCODONE-HOMATROPINE 5-1.5 MG/5ML PO SYRP: ORAL_SOLUTION | ORAL | 0 refills | Status: DC

## 2017-09-14 MED ORDER — PREDNISONE 20 MG PO TABS: ORAL_TABLET | ORAL | 0 refills | Status: DC

## 2017-09-14 NOTE — Discharge Instructions (Signed)
Take plain guaifenesin (1200mg  extended release tabs such as Mucinex) twice daily, with plenty of water, for cough and congestion, or may continue Mucinex D.  Get adequate rest.     Stop all antihistamines for now, and other non-prescription cough/cold preparations.

## 2017-09-14 NOTE — ED Provider Notes (Signed)
[facility]    CSN: [id number] Arrival date & time: 09/14/17  0959     History   Chief Complaint Chief Complaint  Patient presents with  . Cough    HPI Cynthia Jennings is a 32 y.o. female.   Patient was treated for a URI and sinusitis ten days ago with Augmentin.  All symptoms improved except for a persistent cough that has increased during the past 2 to 3 days.  No pleuritic pain but she feels tightness in her anterior chest.  No fevers, chills, and sweats.   The history is provided by the patient.    Past Medical History:  Diagnosis Date  . Hypothyroid     Patient Active Problem List   Diagnosis Date Noted  . Anemia 02/19/2017  . Seasonal allergic rhinitis due to pollen 02/19/2017  . Olecranon bursitis of right elbow 07/17/2016  . Food allergy 04/11/2016  . History of Bell's palsy 09/02/2014  . History of GI bleed 08/13/2014  . Hypothyroidism 08/10/2014    Past Surgical History:  Procedure Laterality Date  . ANKLE SURGERY  2001  . APPENDECTOMY  2011  . CHOLECYSTECTOMY    . KNEE SURGERY  2006  . TONSILLECTOMY  1996    OB History    No data available       Home Medications    Prior to Admission medications   Medication Sig Start Date End Date Taking? Authorizing Provider  azithromycin (ZITHROMAX Z-PAK) 250 MG tablet Take 2 tabs today; then begin one tab once daily for 4 more days. 09/14/17   Lattie Haw, MD  HYDROcodone-homatropine (HYCODAN) 5-1.5 MG/5ML syrup Take 5mL PO HS PRN cough.  May repeat 6 hours later. 09/14/17   Lattie Haw, MD  ipratropium (ATROVENT) 0.06 % nasal spray Place 2 sprays into both nostrils 4 (four) times daily. 09/04/17   Sunnie Nielsen, DO  levocetirizine (XYZAL) 5 MG tablet Take 1 tablet (5 mg total) by mouth every evening. 02/19/17   Rodolph Bong, MD  levothyroxine (SYNTHROID, LEVOTHROID) 112 MCG tablet Take 1 tablet (112 mcg total) by mouth daily before breakfast. 07/23/17   Rodolph Bong, MD  Multiple  Vitamin (MULTIVITAMIN) capsule Take 1 capsule by mouth.    [provider]  Omega-3 1000 MG CAPS Take 1 g by mouth.    [provider]  predniSONE (DELTASONE) 20 MG tablet Take one tab by mouth twice daily for 4 days. Take with food. 09/14/17   Lattie Haw, MD  WHEY PROTEIN PO Take 26 g/day by mouth.    [provider]    Family History No family history on file.  Social History Social History   Tobacco Use  . Smoking status: Never Smoker  . Smokeless tobacco: Never Used  Substance Use Topics  . Alcohol use: Yes    Alcohol/week: 0.0 oz    Comment: occasional  . Drug use: No     Allergies   Sodium metabisulfite; Sulfites; Codeine; Cephalexin; Sulfamethoxazole; and Sulfa antibiotics   Review of Systems Review of Systems No sore throat + cough No pleuritic pain No wheezing No nasal congestion No post-nasal drainage No sinus pain/pressure No itchy/red eyes No earache No hemoptysis No SOB No fever/chills No nausea No vomiting No abdominal pain No diarrhea No urinary symptoms No skin rash No fatigue No myalgias No headache Used OTC meds without relief   Physical Exam Triage Vital Signs ED Triage Vitals [09/14/17 1014]  Enc Vitals Group  BP 105/73     Pulse Rate 100     Resp 16     Temp 98.1 F (36.7 C)     Temp Source Oral     SpO2 99 %     Weight      Height      Head Circumference      Peak Flow      Pain Score      Pain Loc      Pain Edu?      Excl. in GC?    No data found.  Updated Vital Signs BP 105/73 (BP Location: Right Arm)   Pulse 100   Temp 98.1 F (36.7 C) (Oral)   Resp 16   LMP 08/31/2017   SpO2 99%   Visual Acuity Right Eye Distance:   Left Eye Distance:   Bilateral Distance:    Right Eye Near:   Left Eye Near:    Bilateral Near:     Physical Exam Nursing notes and Vital Signs reviewed. Appearance:  Patient appears stated age, and in no acute distress Eyes:  Pupils are equal, round,  and reactive to light and accomodation.  Extraocular movement is intact.  Conjunctivae are not inflamed  Ears:  Canals normal.  Tympanic membranes normal.  Nose:  Mildly congested turbinates.  No sinus tenderness.    Pharynx:  Normal Neck:  Supple.  Enlarged posterior/lateral nodes are palpated bilaterally, tender to palpation on the left.   Lungs:  Clear to auscultation.  Breath sounds are equal.  Moving air well. Chest:  Distinct tenderness to palpation over the mid-sternum.  Heart:  Regular rate and rhythm without murmurs, rubs, or gallops.  Abdomen:  Nontender without masses or hepatosplenomegaly.  Bowel sounds are present.  No CVA or flank tenderness.  Extremities:  No edema.  Skin:  No rash present.    UC Treatments / Results  Labs (all labs ordered are listed, but only abnormal results are displayed) Labs Reviewed - No data to display  EKG  EKG Interpretation None       Radiology Dg Chest 2 View  Result Date: 09/14/2017 CLINICAL DATA:  32 year old female with history of cough for the past 10 days. Central chest pain for the past 3 days. EXAM: CHEST  2 VIEW COMPARISON:  Chest x-ray 09/19/2015. FINDINGS: Lung volumes are normal. No consolidative airspace disease. No pleural effusions. No pneumothorax. No pulmonary nodule or mass noted. Pulmonary vasculature and the cardiomediastinal silhouette are within normal limits. IMPRESSION: No radiographic evidence of acute cardiopulmonary disease. Electronically Signed   By: Trudie Reedaniel  Entrikin M.D.   On: 09/14/2017 10:30    Procedures Procedures (including critical care time)  Medications Ordered in UC Medications - No data to display   Initial Impression / Assessment and Plan / UC Course  I have reviewed the triage vital signs and the nursing notes.  Pertinent labs & imaging results that were available during my care of the patient were reviewed by me and considered in my medical decision making (see chart for details).    Begin  Z-pak for atypical coverage. Begin brief prednisone burst. Rx for Hydromet at bedtime prn. Controlled Substance Prescriptions I have consulted the [place] Controlled Substances Registry for this patient, and feel the risk/benefit ratio today is favorable for proceeding with this prescription for a controlled substance.   Take plain guaifenesin (1200mg  extended release tabs such as Mucinex) twice daily, with plenty of water, for cough and congestion, or may continue Mucinex D.  Get adequate rest.     Stop all antihistamines for now, and other non-prescription cough/cold preparations. Followup with Family Doctor if not improved in six days.   Final Clinical Impressions(s) / UC Diagnoses   Final diagnoses:  Persistent cough    ED Discharge Orders        Ordered    azithromycin (ZITHROMAX Z-PAK) 250 MG tablet     09/14/17 1113    predniSONE (DELTASONE) 20 MG tablet     09/14/17 1113    HYDROcodone-homatropine (HYCODAN) 5-1.5 MG/5ML syrup     09/14/17 1113           Lattie Haw, MD 09/20/17 [phone]

## 2017-09-14 NOTE — ED Triage Notes (Signed)
Has been treated for sinusitis/URI 09-04-17; everything has cleared except cough. No OTCs today. Finished ABX 4 days ago.

## 2018-02-24 ENCOUNTER — Encounter: Payer: Self-pay | Admitting: Family Medicine

## 2018-02-24 ENCOUNTER — Ambulatory Visit: Payer: BC Managed Care – PPO | Admitting: Family Medicine

## 2018-02-24 VITALS — BP 119/82 | HR 81 | Ht 67.0 in | Wt 169.0 lb

## 2018-02-24 DIAGNOSIS — E039 Hypothyroidism, unspecified: Secondary | ICD-10-CM

## 2018-02-24 DIAGNOSIS — Z6826 Body mass index (BMI) 26.0-26.9, adult: Secondary | ICD-10-CM | POA: Diagnosis not present

## 2018-02-24 LAB — TSH: TSH: 1.75 m[IU]/L

## 2018-02-24 NOTE — Progress Notes (Signed)
Cynthia Jennings is a 32 y.o. female who presents to [facility] today for hypothyroidism.  Cynthia Jennings has hypothyroidism well managed with levothyroxine.  She is been on the stable dose now for over 6 months.  She feels well without feeling too hot or too cold or skin changes.  She is due for recheck and has a few more pills left.  She notes that her BMI is above the normal range however she is muscular and works as a Audiological scientist.  She likely not a bit and has had trouble maintaining the very low carbohydrate diet required.  She is pretty happy with how things are going with her life.  She notes that she is due for a cervical cancer screening test but would like a female provider to do so.  She will schedule an appointment with Dr. Lyn Jennings in the near future.   ROS as above:  Exam:  BP 119/82   Pulse 81   Ht 5\' 7"  (1.702 m)   Wt 169 lb (76.7 kg)   BMI 26.47 kg/m   Wt Readings from Last 5 Encounters:  02/24/18 169 lb (76.7 kg)  09/04/17 170 lb 1.9 oz (77.2 kg)  07/22/17 168 lb (76.2 kg)  02/19/17 166 lb (75.3 kg)  07/17/16 157 lb (71.2 kg)    Gen: Well NAD HEENT: EOMI,  MMM no goiter Lungs: Normal work of breathing. CTABL Heart: RRR no MRG Abd: NABS, Soft. Nondistended, Nontender Exts: Brisk capillary refill, warm and well perfused.   Lab and Radiology Results Lab Results  Component Value Date   TSH 2.89 07/22/2017      Assessment and Plan: 32 y.o. female with  Hypothyroidism: Clinically doing well.  Recheck TSH if all is well we will prescribe levothyroxine for 1 year.  Annual rechecks at that point.  Overweight: BMI greater than 25.  She could lose a few pounds and I think should have a improved health.  Discussed slightly lean diet otherwise continue exercise.  Cervical cancer screening: Patient will schedule with Dr. Lyn Jennings in the  near future.   Orders Placed This Encounter  Procedures  . TSH   No orders of the defined types were placed in this encounter.    Historical information moved to improve visibility of documentation.  Past Medical History:  Diagnosis Date  . Hypothyroid    Past Surgical History:  Procedure Laterality Date  . ANKLE SURGERY  2001  . APPENDECTOMY  2011  . CHOLECYSTECTOMY    . KNEE SURGERY  2006  . TONSILLECTOMY  1996   Social History   Tobacco Use  . Smoking status: Never Smoker  . Smokeless tobacco: Never Used  Substance Use Topics  . Alcohol use: Yes    Alcohol/week: 0.0 oz    Comment: occasional   family history is not on file.  Medications: Current Outpatient Medications  Medication Sig Dispense Refill  . levocetirizine (XYZAL) 5 MG tablet Take 1 tablet (5 mg total) by mouth every evening. 30 tablet 12  . levothyroxine (SYNTHROID, LEVOTHROID) 112 MCG tablet Take 1 tablet (112 mcg total) by mouth daily before breakfast. 90 tablet 1  . Multiple Vitamin (MULTIVITAMIN) capsule Take 1 capsule by mouth.    . Omega-3 1000 MG CAPS Take 1 g by mouth.    . WHEY PROTEIN PO Take 26 g/day by mouth.     No current facility-administered medications for this visit.  Allergies  Allergen Reactions  . Sodium Metabisulfite Shortness Of Breath and Swelling  . Sulfites Anaphylaxis  . Codeine Nausea And Vomiting  . Cephalexin Nausea Only    unsure Abdominal pain, nausea and fever   . Sulfamethoxazole Hives  . Sulfa Antibiotics Rash    unknown     Discussed warning signs or symptoms. Please see discharge instructions. Patient expresses understanding.

## 2018-02-24 NOTE — Patient Instructions (Signed)
Thank you for coming in today. Get lab today.  Follow up with Dr Lyn HollingsheadAlexander for Pap smear in the near future.  If all is well recheck yearly

## 2018-02-25 MED ORDER — LEVOTHYROXINE SODIUM 112 MCG PO TABS: 112.0000 ug | ORAL_TABLET | Freq: Every day | ORAL | 3 refills | Status: DC

## 2018-02-25 NOTE — Addendum Note (Signed)
Addended by: Rodolph BongOREY, Cylah Fannin S on: 02/25/2018 07:04 AM   Modules accepted: Orders

## 2018-02-28 ENCOUNTER — Other Ambulatory Visit: Payer: Self-pay

## 2018-02-28 MED ORDER — LEVOCETIRIZINE DIHYDROCHLORIDE 5 MG PO TABS: 5.0000 mg | ORAL_TABLET | Freq: Every evening | ORAL | 12 refills | Status: DC

## 2018-04-03 ENCOUNTER — Other Ambulatory Visit (HOSPITAL_COMMUNITY)
Admission: RE | Admit: 2018-04-03 | Discharge: 2018-04-03 | Disposition: A | Discharge status: Home / Self Care | Payer: BC Managed Care – PPO | Source: Ambulatory Visit | Attending: Osteopathic Medicine | Admitting: Osteopathic Medicine

## 2018-04-03 ENCOUNTER — Ambulatory Visit (INDEPENDENT_AMBULATORY_CARE_PROVIDER_SITE_OTHER): Payer: BC Managed Care – PPO | Admitting: Osteopathic Medicine

## 2018-04-03 ENCOUNTER — Telehealth: Payer: Self-pay | Admitting: Osteopathic Medicine

## 2018-04-03 ENCOUNTER — Encounter: Payer: Self-pay | Admitting: Osteopathic Medicine

## 2018-04-03 VITALS — BP 135/84 | HR 106 | Temp 98.2°F | Wt 169.6 lb

## 2018-04-03 DIAGNOSIS — H60331 Swimmer's ear, right ear: Secondary | ICD-10-CM

## 2018-04-03 DIAGNOSIS — H9201 Otalgia, right ear: Secondary | ICD-10-CM

## 2018-04-03 DIAGNOSIS — Z0000 Encounter for general adult medical examination without abnormal findings: Secondary | ICD-10-CM | POA: Diagnosis present

## 2018-04-03 DIAGNOSIS — H6091 Unspecified otitis externa, right ear: Secondary | ICD-10-CM

## 2018-04-03 DIAGNOSIS — Z113 Encounter for screening for infections with a predominantly sexual mode of transmission: Secondary | ICD-10-CM | POA: Diagnosis not present

## 2018-04-03 MED ORDER — CIPROFLOXACIN-DEXAMETHASONE 0.3-0.1 % OT SUSP: 4.0000 [drp] | Freq: Two times a day (BID) | OTIC | 0 refills | Status: AC

## 2018-04-03 MED ORDER — CIPROFLOXACIN-DEXAMETHASONE 0.3-0.1 % OT SUSP: 4.0000 [drp] | Freq: Two times a day (BID) | OTIC | 0 refills | Status: DC

## 2018-04-03 MED ORDER — ACETIC ACID 2 % OT SOLN: 4.0000 [drp] | Freq: Every day | OTIC | 1 refills | Status: DC | PRN

## 2018-04-03 MED ORDER — OFLOXACIN 0.3 % OT SOLN: 10.0000 [drp] | Freq: Every day | OTIC | 0 refills | Status: DC

## 2018-04-03 NOTE — Telephone Encounter (Signed)
Called pharmacy and cancelled Rx.

## 2018-04-03 NOTE — Patient Instructions (Addendum)
General Preventive Care  Most recent routine screening lipids/other labs: ordered today   Tobacco: don't! Alcohol: moderation is ok for most people. Recreational/Illicit Drugs: don't!  Exercise: as tolerated to reduce risk of cardiovascular disease and diabetes  Mental health: if need for mental health care (medicines, counseling, other), or concerns about moods, please let me know!   Sexual health: if need for STD testing, please let me know! Blood draw needed for HIV, syphilis, hepatitis.   Vaccines  Flu vaccine: recommended every fall (by Halloween!)  Shingles vaccine: Shingrix recommended after age 31  Pneumonia vaccines: Prevnar and Pneumovax recommended after age 31  Tetanus booster: Tdap recommended every 10 years - you're good until 10/2027  Cancer screenings   Colon cancer screening: recommended at age 31, colonoscopy sooner if risk factors   Breast cancer screening: mammogram recommended at age 31  Cervical cancer screening: every 1 to 5 years depending on age and other risk factors.   Infection screenings . HIV: recommended screening at least once age 31-65, more often if risk factors  . Gonorrhea/Chlamydia: otherwise screening as needed . Hepatitis C: recommended for anyone born [id number] - not an issue for you!  Other . Bone Density Test: recommended for women at age 31 . Advanced Directive: Living Will and/or Healthcare Power of Attorney recommended for everyone, regardless of age or health . Cholesterol & Diabetes: recommended screening annually . Thyroid and Vitamin D: routine screening not recommended, most insurance will not cover this testing        Swimmer's Ear:  Prevention: To avoid getting water in ear while bathing  Put vaseline coated cotton in ear   Ear plugs  Tight fitting bathing cap After bathing or swimming  Dry canal with hair dryer on lowest setting Avoid ear Trauma Avoid cotton-tipped swabs in ear Avoid scratching inside  ear Instill 1-2 drops of the following after drying out from swimming  Otic Acetic Acid solution  Treatment now: Instill antibiotics as directed for now to treat infection   Ofloxacin Otic solution

## 2018-04-03 NOTE — Telephone Encounter (Signed)
-----   Message from Sunnie NielsenNatalie Alexander, DO sent at 04/03/2018  3:37 PM EDT ----- Regarding: cancel rx  Can we call [facility] and cancel drops? Had to change to to [facility]. Thanks!

## 2018-04-03 NOTE — Progress Notes (Signed)
HPI: Cynthia Jennings is a 32 y.o. female who  has a past medical history of Hypothyroid.  she presents to [facility] [place] today, 04/03/18,  for chief complaint of: Annual Physical    Patient here for annual physical / wellness exam.  See preventive care reviewed as below.  Recent labs reviewed in detail with the patient.   Additional concerns today include:  R ear pain, frequent swimmers ear      Past medical, surgical, social and family history reviewed:  Patient Active Problem List   Diagnosis Date Noted  . Anemia 02/19/2017  . Seasonal allergic rhinitis due to pollen 02/19/2017  . Conductive hearing loss of right ear with unrestricted hearing of left ear 10/05/2016  . Chronic reactive otitis externa of right ear 10/05/2016  . Olecranon bursitis of right elbow 07/17/2016  . Food allergy 04/11/2016  . History of Bell's palsy 09/02/2014  . History of GI bleed 08/13/2014  . Hypothyroidism 08/10/2014  . Ovarian cyst 07/01/2009    Past Surgical History:  Procedure Laterality Date  . ANKLE SURGERY  2001  . APPENDECTOMY  2011  . CHOLECYSTECTOMY    . KNEE SURGERY  2006  . TONSILLECTOMY  1996    Social History   Tobacco Use  . Smoking status: Never Smoker  . Smokeless tobacco: Never Used  Substance Use Topics  . Alcohol use: Yes    Alcohol/week: 0.0 standard drinks    Comment: occasional    No family history on file.   Current medication list and allergy/intolerance information reviewed:    Current Outpatient Medications  Medication Sig Dispense Refill  . EPINEPHrine 0.3 mg/0.3 mL IJ SOAJ injection Inject into the muscle.    . levocetirizine (XYZAL) 5 MG tablet Take 1 tablet (5 mg total) by mouth every evening. 30 tablet 12  . levothyroxine (SYNTHROID, LEVOTHROID) 112 MCG tablet Take 1 tablet (112 mcg total) by mouth daily before breakfast. 90 tablet 3  . Multiple Vitamin (MULTIVITAMIN) capsule Take 1 capsule by mouth.    . Omega-3  1000 MG CAPS Take 1 g by mouth.    . WHEY PROTEIN PO Take 26 g/day by mouth.     No current facility-administered medications for this visit.     Allergies  Allergen Reactions  . Sodium Metabisulfite Shortness Of Breath and Swelling  . Sulfamethoxazole Hives  . Sulfites Anaphylaxis  . Codeine Nausea And Vomiting  . Cephalexin Nausea Only    unsure Abdominal pain, nausea and fever   . Sulfa Antibiotics Rash    unknown      Review of Systems:  Constitutional:  No  fever, no chills, No recent illness, No unintentional weight changes. No significant fatigue.   HEENT: No  headache, no vision change, no hearing change, No sore throat, No  sinus pressure, +ear pain as per HPI for few days now  Cardiac: No  chest pain, No  pressure, No palpitations  Respiratory:  No  shortness of breath. No  Cough  Gastrointestinal: No  abdominal pain, No  nausea, No  vomiting,  No  blood in stool, No  diarrhea, No  constipation   Musculoskeletal: No new myalgia/arthralgia  Skin: No  Rash, No other wounds/concerning lesions  Genitourinary: No  incontinence, No  abnormal genital bleeding, No abnormal genital discharge  Hem/Onc: No  easy bruising/bleeding, No  abnormal lymph node  Endocrine: No cold intolerance,  No heat intolerance. No polyuria/polydipsia/polyphagia   Neurologic: No  weakness, No  dizziness,  No  slurred speech/focal weakness/facial droop  Psychiatric: No  concerns with depression, No  concerns with anxiety, No sleep problems, No mood problems  Exam:  BP 135/84 (BP Location: Left Arm, Patient Position: Sitting, Cuff Size: Normal)   Pulse (!) 106   Temp 98.2 F (36.8 C) (Oral)   Wt 169 lb 9.6 oz (76.9 kg)   BMI 26.56 kg/m   Constitutional: VS see above. General Appearance: alert, well-developed, well-nourished, NAD  Eyes: Normal lids and conjunctive, non-icteric sclera  Ears, Nose, Mouth, Throat: MMM, Normal external inspection ears/nares/mouth/lips/gums. TM normal  bilaterally. Pharynx/tonsils no erythema, no exudate. Nasal mucosa normal.   Neck: No masses, trachea midline. No thyroid enlargement. No tenderness/mass appreciated. No lymphadenopathy  Respiratory: Normal respiratory effort. no wheeze, no rhonchi, no rales  Cardiovascular: S1/S2 normal, no murmur, no rub/gallop auscultated. RRR. No lower extremity edema.  Gastrointestinal: Nontender, no masses. No hepatomegaly, no splenomegaly. No hernia appreciated. Bowel sounds normal. Rectal exam deferred.   Musculoskeletal: Gait normal. No clubbing/cyanosis of digits.   Neurological: Normal balance/coordination. No tremor. No cranial nerve deficit on limited exam. Motor and sensation intact and symmetric. Cerebellar reflexes intact.   Skin: warm, dry, intact. No rash/ulcer. No concerning nevi or subq nodules on limited exam.    Psychiatric: Normal judgment/insight. Normal mood and affect. Oriented x3.  GYN: No lesions/ulcers to external genitalia, normal urethra, normal vaginal mucosa, physiologic discharge, cervix normal without lesions, uterus not enlarged or tender, adnexa no masses and nontender  BREAST: No rashes/skin changes, normal fibrous breast tissue, no masses or tenderness, normal nipple without discharge, normal axilla      ASSESSMENT/PLAN:   Screening for STDs (sexually transmitted diseases) - Plan: C. trachomatis/N. gonorrhoeae RNA  Right ear pain  Acute swimmer's ear of right side  Annual physical exam - Plan: CBC, COMPLETE METABOLIC PANEL WITH GFR, Lipid panel, Cytology - PAP  Right otitis externa - Plan: ciprofloxacin-dexamethasone (CIPRODEX) OTIC suspension, DISCONTINUED: ciprofloxacin-dexamethasone (CIPRODEX) OTIC suspension   Mom had breast biopsy, benign.  Declines HIV/RPR     Patient Instructions  General Preventive Care  Most recent routine screening lipids/other labs: ordered today   Tobacco: don't! Alcohol: moderation is ok for most people.  Recreational/Illicit Drugs: don't!  Exercise: as tolerated to reduce risk of cardiovascular disease and diabetes  Mental health: if need for mental health care (medicines, counseling, other), or concerns about moods, please let me know!   Sexual health: if need for STD testing, please let me know! Blood draw needed for HIV, syphilis, hepatitis.   Vaccines  Flu vaccine: recommended every fall (by Halloween!)  Shingles vaccine: Shingrix recommended after age 61  Pneumonia vaccines: Prevnar and Pneumovax recommended after age 33  Tetanus booster: Tdap recommended every 10 years - you're good until 10/2027  Cancer screenings   Colon cancer screening: recommended at age 67, colonoscopy sooner if risk factors   Breast cancer screening: mammogram recommended at age 102  Cervical cancer screening: every 1 to 5 years depending on age and other risk factors.   Infection screenings . HIV: recommended screening at least once age 38-65, more often if risk factors  . Gonorrhea/Chlamydia: otherwise screening as needed . Hepatitis C: recommended for anyone born 72-1964 - not an issue for you!  Other . Bone Density Test: recommended for women at age 108 . Advanced Directive: Living Will and/or Healthcare Power of Attorney recommended for everyone, regardless of age or health . Cholesterol & Diabetes: recommended screening annually . Thyroid and Vitamin D: routine  screening not recommended, most insurance will not cover this testing        Swimmer's Ear:  Prevention: To avoid getting water in ear while bathing  Put vaseline coated cotton in ear   Ear plugs  Tight fitting bathing cap After bathing or swimming  Dry canal with hair dryer on lowest setting Avoid ear Trauma Avoid cotton-tipped swabs in ear Avoid scratching inside ear Instill 1-2 drops of the following after drying out from swimming  Otic Acetic Acid solution  Treatment now: Instill antibiotics as directed for  now to treat infection   Ofloxacin Otic solution            Visit summary with medication list and pertinent instructions was printed for patient to review. All questions at time of visit were answered - patient instructed to contact office with any additional concerns. ER/RTC precautions were reviewed with the patient.   Follow-up plan: Return in about 1 year (around 04/04/2019) for annual physical and labs, sooner if needed! .    Please note: voice recognition software was used to produce this document, and typos may escape review. Please contact Dr. Lyn Hollingshead for any needed clarifications.

## 2018-04-04 LAB — C. TRACHOMATIS/N. GONORRHOEAE RNA
C. trachomatis RNA, TMA: NOT DETECTED
N. GONORRHOEAE RNA, TMA: NOT DETECTED

## 2018-04-08 LAB — CYTOLOGY - PAP
Diagnosis: NEGATIVE
HPV: NOT DETECTED

## 2019-01-26 ENCOUNTER — Ambulatory Visit (INDEPENDENT_AMBULATORY_CARE_PROVIDER_SITE_OTHER): Payer: BC Managed Care – PPO

## 2019-01-26 ENCOUNTER — Encounter: Payer: Self-pay | Admitting: Family Medicine

## 2019-01-26 ENCOUNTER — Other Ambulatory Visit: Payer: Self-pay

## 2019-01-26 ENCOUNTER — Ambulatory Visit: Payer: BC Managed Care – PPO | Admitting: Family Medicine

## 2019-01-26 VITALS — BP 130/87 | HR 69 | Temp 98.2°F | Wt 159.0 lb

## 2019-01-26 DIAGNOSIS — E039 Hypothyroidism, unspecified: Secondary | ICD-10-CM

## 2019-01-26 DIAGNOSIS — R1012 Left upper quadrant pain: Secondary | ICD-10-CM

## 2019-01-26 LAB — CBC WITH DIFFERENTIAL/PLATELET
Absolute Monocytes: 340 cells/uL (ref 200–950)
Basophils Absolute: 38 cells/uL (ref 0–200)
Basophils Relative: 0.6 %
Eosinophils Absolute: 13 cells/uL — ABNORMAL LOW (ref 15–500)
Eosinophils Relative: 0.2 %
HCT: 37.4 % (ref 35.0–45.0)
Hemoglobin: 12.6 g/dL (ref 11.7–15.5)
Lymphs Abs: 1279 cells/uL (ref 850–3900)
MCH: 28.4 pg (ref 27.0–33.0)
MCHC: 33.7 g/dL (ref 32.0–36.0)
MCV: 84.2 fL (ref 80.0–100.0)
MPV: 9.9 fL (ref 7.5–12.5)
Monocytes Relative: 5.4 %
Neutro Abs: 4631 cells/uL (ref 1500–7800)
Neutrophils Relative %: 73.5 %
Platelets: 332 10*3/uL (ref 140–400)
RBC: 4.44 10*6/uL (ref 3.80–5.10)
RDW: 13 % (ref 11.0–15.0)
Total Lymphocyte: 20.3 %
WBC: 6.3 10*3/uL (ref 3.8–10.8)

## 2019-01-26 LAB — COMPLETE METABOLIC PANEL WITH GFR
AG Ratio: 2 (calc) (ref 1.0–2.5)
ALT: 10 U/L (ref 6–29)
AST: 15 U/L (ref 10–30)
Albumin: 4.9 g/dL (ref 3.6–5.1)
Alkaline phosphatase (APISO): 67 U/L (ref 31–125)
BUN: 12 mg/dL (ref 7–25)
CO2: 28 mmol/L (ref 20–32)
Calcium: 9.8 mg/dL (ref 8.6–10.2)
Chloride: 100 mmol/L (ref 98–110)
Creat: 0.99 mg/dL (ref 0.50–1.10)
GFR, Est African American: 87 mL/min/{1.73_m2} (ref >=60)
GFR, Est Non African American: 75 mL/min/{1.73_m2} (ref >=60)
Globulin: 2.5 g/dL (calc) (ref 1.9–3.7)
Glucose, Bld: 100 mg/dL — ABNORMAL HIGH (ref 65–99)
Potassium: 4.4 mmol/L (ref 3.5–5.3)
Sodium: 137 mmol/L (ref 135–146)
Total Bilirubin: 0.7 mg/dL (ref 0.2–1.2)
Total Protein: 7.4 g/dL (ref 6.1–8.1)

## 2019-01-26 LAB — LIPASE: Lipase: 48 U/L (ref 7–60)

## 2019-01-26 MED ORDER — PANTOPRAZOLE SODIUM 40 MG PO TBEC: 40.0000 mg | DELAYED_RELEASE_TABLET | Freq: Every day | ORAL | 3 refills | Status: DC

## 2019-01-26 MED ORDER — SUCRALFATE 1 G PO TABS: 1.0000 g | ORAL_TABLET | Freq: Four times a day (QID) | ORAL | 0 refills | Status: DC

## 2019-01-26 MED ORDER — IOPAMIDOL (ISOVUE-300) INJECTION 61%
100.0000 mL | Freq: Once | INTRAVENOUS | Status: AC | PRN
  Administered 2019-01-26: 100 mL via INTRAVENOUS

## 2019-01-26 NOTE — Patient Instructions (Signed)
Thank you for coming in today. Get labs and CT scan now.  You will hear from me tonight or tomorrow morning about results.  Take protonix daily for twice daily and carafate 4x daily.    Abdominal Pain, Adult Abdominal pain can be caused by many things. Often, abdominal pain is not serious and it gets better with no treatment or by being treated at home. However, sometimes abdominal pain is serious. Your health care provider will do a medical history and a physical exam to try to determine the cause of your abdominal pain. Follow these instructions at home:  Take over-the-counter and prescription medicines only as told by your health care provider. Do not take a laxative unless told by your health care provider.  Drink enough fluid to keep your urine clear or pale yellow.  Watch your condition for any changes.  Keep all follow-up visits as told by your health care provider. This is important. Contact a health care provider if:  Your abdominal pain changes or gets worse.  You are not hungry or you lose weight without trying.  You are constipated or have diarrhea for more than 2-3 days.  You have pain when you urinate or have a bowel movement.  Your abdominal pain wakes you up at night.  Your pain gets worse with meals, after eating, or with certain foods.  You are throwing up and cannot keep anything down.  You have a fever. Get help right away if:  Your pain does not go away as soon as your health care provider told you to expect.  You cannot stop throwing up.  Your pain is only in areas of the abdomen, such as the right side or the left lower portion of the abdomen.  You have bloody or black stools, or stools that look like tar.  You have severe pain, cramping, or bloating in your abdomen.  You have signs of dehydration, such as: ? Dark urine, very little urine, or no urine. ? Cracked lips. ? Dry mouth. ? Sunken eyes. ? Sleepiness. ? Weakness. This information is  not intended to replace advice given to you by your health care provider. Make sure you discuss any questions you have with your health care provider. Document Released: 05/23/2005 Document Revised: 03/02/2016 Document Reviewed: 01/25/2016 Elsevier Interactive Patient Education  2019 [employer].

## 2019-01-26 NOTE — Progress Notes (Signed)
Cynthia Jennings is a 33 y.o. female who [name] today for abdominal pain.  Starting yesterday Cynthia EndoKelly developed some upper left quadrant abdominal pain.  She felt generally somewhat ill with decreased appetite and some nausea.  Her symptoms worsened today.  She describes a stabbing sensation in her upper left quadrant.  She is tried Tums which did not help.  She denies any sick contacts no fevers or chills vomiting or diarrhea.  She denies any blood in the stool or melanotic stool.  She notes that she is currently having a menstrual period but denies any vaginal discharge.  Additionally she denies any urinary symptoms.  She denies any heavy alcohol use.  She denies any unusual food.  Is a pertinent medical history for gastritis and duodenitis and esophagitis confirmed on EGD in 2014. ROS as above:  Exam:  BP 130/87   Pulse 69   Temp 98.2 F (36.8 C) (Oral)   Wt 159 lb (72.1 kg)   BMI 24.90 kg/m  Wt Readings from Last 5 Encounters:  01/26/19 159 lb (72.1 kg)  04/03/18 169 lb 9.6 oz (76.9 kg)  02/24/18 169 lb (76.7 kg)  09/04/17 170 lb 1.9 oz (77.2 kg)  07/22/17 168 lb (76.2 kg)    Gen: Well NAD HEENT: EOMI,  MMM Lungs: Normal work of breathing. CTABL Heart: RRR no MRG Abd: NABS, Soft. Nondistended, tender to palpation upper left quadrant with mild rebound and mild guarding.  No masses palpated. Exts: Brisk capillary refill, warm and well perfused.    Patient was given a GI cocktail and had no benefit.  Assessment and Plan: 33 y.o. female with upper left quadrant abdominal pain.  Unclear etiology but history of gastritis and duodenitis is obviously concerning.  Patient did not have much benefit from GI cocktail therefore I am also considering other etiologies such as splenic issue or acute abdomen.  Plan for stat labs listed below as well as CT scan  abdomen and pelvis.  We will go ahead and prescribe now Carafate and Protonix and follow-up after imaging.  PDMP not reviewed this encounter. Orders Placed This Encounter  Procedures  . CT Abdomen Pelvis W Contrast    Per rachel, ct authorized. rr    Standing Status:   Future    Standing Expiration Date:   04/27/2020    Order Specific Question:   If indicated for the ordered procedure, I authorize the administration of contrast media per Radiology protocol    Answer:   Yes    Order Specific Question:   Is patient pregnant?    Answer:   No    Order Specific Question:   Preferred imaging location?    Answer:   [facility]    Order Specific Question:   Is Oral Contrast requested for this exam?    Answer:   Yes, Per Radiology protocol    Order Specific Question:   Radiology Contrast Protocol - do NOT remove file path    Answer:   [email or web address]  . CBC with Differential/Platelet  . COMPLETE METABOLIC PANEL WITH GFR  . Lipase  . TSH   Meds ordered this encounter  Medications  . pantoprazole (PROTONIX) 40 MG tablet    Sig: Take 1 tablet (40 mg total) by mouth daily.    Dispense:  30 tablet    Refill:  3  . sucralfate (CARAFATE) 1 g tablet    Sig: Take 1  tablet (1 g total) by mouth 4 (four) times daily.    Dispense:  120 tablet    Refill:  0     Historical information moved to improve visibility of documentation.  Past Medical History:  Diagnosis Date  . Hypothyroid    Past Surgical History:  Procedure Laterality Date  . ANKLE SURGERY  2001  . APPENDECTOMY  2011  . CHOLECYSTECTOMY    . KNEE SURGERY  2006  . TONSILLECTOMY  1996   Social History   Tobacco Use  . Smoking status: Never Smoker  . Smokeless tobacco: Never Used  Substance Use Topics  . Alcohol use: Yes    Alcohol/week: 0.0 standard drinks    Comment: occasional   family history is not on file.  Medications: Current Outpatient Medications  Medication Sig Dispense  Refill  . acetic acid 2 % otic solution Place 4 drops into both ears daily as needed (swimming). 15 mL 1  . EPINEPHrine 0.3 mg/0.3 mL IJ SOAJ injection Inject into the muscle.    . levocetirizine (XYZAL) 5 MG tablet Take 1 tablet (5 mg total) by mouth every evening. 30 tablet 12  . levothyroxine (SYNTHROID, LEVOTHROID) 112 MCG tablet Take 1 tablet (112 mcg total) by mouth daily before breakfast. 90 tablet 3  . Multiple Vitamin (MULTIVITAMIN) capsule Take 1 capsule by mouth.    . Omega-3 1000 MG CAPS Take 1 g by mouth.    . WHEY PROTEIN PO Take 26 g/day by mouth.    . pantoprazole (PROTONIX) 40 MG tablet Take 1 tablet (40 mg total) by mouth daily. 30 tablet 3  . sucralfate (CARAFATE) 1 g tablet Take 1 tablet (1 g total) by mouth 4 (four) times daily. 120 tablet 0   No current facility-administered medications for this visit.    Allergies  Allergen Reactions  . Sodium Metabisulfite Shortness Of Breath and Swelling  . Sulfamethoxazole Hives  . Sulfites Anaphylaxis  . Codeine Nausea And Vomiting  . Cephalexin Nausea Only    unsure Abdominal pain, nausea and fever   . Sulfa Antibiotics Rash    unknown     Discussed warning signs or symptoms. Please see discharge instructions. Patient expresses understanding.

## 2019-01-27 LAB — TSH: TSH: 1.52 mIU/L

## 2019-01-29 ENCOUNTER — Other Ambulatory Visit: Payer: Self-pay

## 2019-01-29 ENCOUNTER — Telehealth: Payer: Self-pay | Admitting: Family Medicine

## 2019-01-29 ENCOUNTER — Ambulatory Visit (INDEPENDENT_AMBULATORY_CARE_PROVIDER_SITE_OTHER): Payer: BC Managed Care – PPO

## 2019-01-29 DIAGNOSIS — R1012 Left upper quadrant pain: Secondary | ICD-10-CM | POA: Diagnosis not present

## 2019-01-29 MED ORDER — DICYCLOMINE HCL 10 MG PO CAPS: 10.0000 mg | ORAL_CAPSULE | Freq: Three times a day (TID) | ORAL | 1 refills | Status: DC

## 2019-01-29 NOTE — Telephone Encounter (Signed)
Azia call back today noting that she continues to have abdominal pain.  I had a lengthy discussion with her.  She continues to experience left upper quadrant abdominal pain wrapping around to her back.  Pain is worse with eating and associate with nausea but not vomiting or diarrhea.  Additionally the pain is somewhat positional as well.  She feels better if she is leaning forward and worse if she is leaning back.  She denies chest pain palpitations or shortness of breath.  She has been taking the Protonix and Carafate with little benefit.  She notes the pain is not really consistent with previous episodes of gastritis.  Reviewed imaging and lab work with patient.  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  Given that she is not improving and there is not a clear explanation for her pain plan to do several things at once. Plan to refer to gastroenterology and recheck CBC metabolic panel lipase and now check lactic acid.  Will get labs stat.  Patient will check chest x-ray to make sure there is no obvious chest etiology for her pain. If not improving patient will likely present the clinic tomorrow.  Total time spent 11 minutes.

## 2019-01-29 NOTE — Telephone Encounter (Signed)
Pt called. Pain in abdomen has not gotten any better on meds(she didn't think coming in would make a difference).

## 2019-01-30 ENCOUNTER — Other Ambulatory Visit: Payer: BC Managed Care – PPO

## 2019-01-30 ENCOUNTER — Encounter: Payer: Self-pay | Admitting: Family Medicine

## 2019-01-30 ENCOUNTER — Telehealth: Payer: Self-pay | Admitting: *Deleted

## 2019-01-30 DIAGNOSIS — Z20822 Contact with and (suspected) exposure to covid-19: Secondary | ICD-10-CM

## 2019-01-30 NOTE — Telephone Encounter (Signed)
-----   Message from Evan S Corey, MD sent at 01/30/2019  9:06 AM EDT ----- Regarding: Needs COVID 19 nasal swab Please schedule patient for COVID-19 nasal swab.  Evan  

## 2019-01-30 NOTE — Telephone Encounter (Signed)
-----   Message from Rodolph Bong, MD sent at 01/30/2019  9:06 AM EDT ----- Regarding: Needs COVID 19 nasal swab Please schedule patient for COVID-19 nasal swab.  Clayburn Pert

## 2019-01-30 NOTE — Telephone Encounter (Signed)
Contacted pt to arrange COVID testing; pt offered and accepted appointment at [facility] site 01/30/2019 at 1015; pt given address, location, and instructions that she, and all occupants of her vehicle should wear a mask; she verbalized understanding; orders placed per protocol.

## 2019-02-02 ENCOUNTER — Telehealth (INDEPENDENT_AMBULATORY_CARE_PROVIDER_SITE_OTHER): Payer: BC Managed Care – PPO | Admitting: Gastroenterology

## 2019-02-02 ENCOUNTER — Encounter: Payer: Self-pay | Admitting: Gastroenterology

## 2019-02-02 ENCOUNTER — Other Ambulatory Visit: Payer: Self-pay

## 2019-02-02 DIAGNOSIS — R1012 Left upper quadrant pain: Secondary | ICD-10-CM

## 2019-02-02 DIAGNOSIS — K279 Peptic ulcer, site unspecified, unspecified as acute or chronic, without hemorrhage or perforation: Secondary | ICD-10-CM

## 2019-02-02 LAB — CBC WITH DIFFERENTIAL/PLATELET
Absolute Monocytes: 484 cells/uL (ref 200–950)
Basophils Absolute: 37 cells/uL (ref 0–200)
Basophils Relative: 0.6 %
Eosinophils Absolute: 31 cells/uL (ref 15–500)
Eosinophils Relative: 0.5 %
HCT: 36.8 % (ref 35.0–45.0)
Hemoglobin: 12.3 g/dL (ref 11.7–15.5)
Lymphs Abs: 1531 cells/uL (ref 850–3900)
MCH: 28.1 pg (ref 27.0–33.0)
MCHC: 33.4 g/dL (ref 32.0–36.0)
MCV: 84 fL (ref 80.0–100.0)
MPV: 10.1 fL (ref 7.5–12.5)
Monocytes Relative: 7.8 %
Neutro Abs: 4117 cells/uL (ref 1500–7800)
Neutrophils Relative %: 66.4 %
Platelets: 323 10*3/uL (ref 140–400)
RBC: 4.38 10*6/uL (ref 3.80–5.10)
RDW: 12.8 % (ref 11.0–15.0)
Total Lymphocyte: 24.7 %
WBC: 6.2 10*3/uL (ref 3.8–10.8)

## 2019-02-02 LAB — LACTIC ACID, PLASMA: LACTIC ACID: 0.9 mmol/L (ref 0.4–1.8)

## 2019-02-02 LAB — COMPLETE METABOLIC PANEL WITH GFR
AG Ratio: 1.9 (calc) (ref 1.0–2.5)
ALT: 7 U/L (ref 6–29)
AST: 12 U/L (ref 10–30)
Albumin: 4.7 g/dL (ref 3.6–5.1)
Alkaline phosphatase (APISO): 56 U/L (ref 31–125)
BUN: 12 mg/dL (ref 7–25)
CO2: 26 mmol/L (ref 20–32)
Calcium: 9.7 mg/dL (ref 8.6–10.2)
Chloride: 102 mmol/L (ref 98–110)
Creat: 1 mg/dL (ref 0.50–1.10)
GFR, Est African American: 86 mL/min/{1.73_m2} (ref >=60)
GFR, Est Non African American: 74 mL/min/{1.73_m2} (ref >=60)
Globulin: 2.5 g/dL (calc) (ref 1.9–3.7)
Glucose, Bld: 96 mg/dL (ref 65–99)
Potassium: 4.9 mmol/L (ref 3.5–5.3)
Sodium: 138 mmol/L (ref 135–146)
Total Bilirubin: 0.5 mg/dL (ref 0.2–1.2)
Total Protein: 7.2 g/dL (ref 6.1–8.1)

## 2019-02-02 LAB — NOVEL CORONAVIRUS, NAA: SARS-CoV-2, NAA: NOT DETECTED

## 2019-02-02 LAB — LIPASE: Lipase: 19 U/L (ref 7–60)

## 2019-02-02 NOTE — Patient Instructions (Signed)
If you are age 32 or older, your body mass index should be between 23-30. Your There is no height or weight on file to calculate BMI. If this is out of the aforementioned range listed, please consider follow up with your Primary Care Provider.  If you are age 84 or younger, your body mass index should be between 19-25. Your There is no height or weight on file to calculate BMI. If this is out of the aformentioned range listed, please consider follow up with your Primary Care Provider.   You have been scheduled for an endoscopy. Please follow written instructions given to you at your visit today. If you use inhalers (even only as needed), please bring them with you on the day of your procedure. Your physician has requested that you go to www.startemmi.com and enter the access code given to you at your visit today. This web site gives a general overview about your procedure. However, you should still follow specific instructions given to you by our office regarding your preparation for the procedure.  To help prevent the possible spread of infection to our patients, communities, and staff; we will be implementing the following measures:  As of now we are not allowing any visitors/family members to accompany you to any upcoming appointments with [facility]. If you have any concerns about this please contact our office to discuss prior to the appointment.   Continue liquid diet add pedilyte.   Thank you,  Dr. Jackquline Denmark

## 2019-02-02 NOTE — Progress Notes (Signed)
Chief Complaint: Abdominal pain  Referring Provider:  Gregor Hams, MD      ASSESSMENT AND PLAN;   #1. LUQ pain. 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  Failed trial of Protonix, Bentyl and Carafate. Nl CBC, CMP and lipase. Has associated nausea.  #2.  H/O PUD on EGD 2014.  Plan: -Continue liquid diet.  Add Pedialyte. -Urgent EGD.  I discussed risks and benefits. -May need trial of Dexilant thereafter. -Hold off on Protonix/Bentyl/Carafate for now. -CT reviewed independently.    HPI:    Cynthia Jennings is a 33 y.o. female  With sudden onset of LUQ abdominal pain x 1 week, exacerbated by eating.  Pain is sharp, stabbing, at times colicky, nonradiating, associated with nausea.  She did have an episode of diarrhea and vomiting.  Underwent CT Abdo/pelvis on 01/26/2019-showing right lower mesenteric lymphadenitis.  She had normal CBC, CMP and lipase.  Has been given a trial of Protonix, Carafate and Bentyl which she has stopped as it either did not help or made her dizzy.  She stopped eating for few days, then started on liquid diet.  This did help somewhat.  Over the last day or 2, still having abdominal pain and she is afraid of eating.  Currently, mild nausea with abdominal pain.  No further diarrhea.  No melena or hematochezia.  She has been able to tolerate liquids without any problems.  Has been on Dexilant in the past.  Past GI procedures: at [place]. -EGD 2014 showing peptic ulcer disease/duodenitis. -Colonoscopy 2011 before appendicectomy-negative for Crohn's.   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  Physically very active Past Medical History:  Diagnosis Date  . Hypothyroid     Past Surgical History:  Procedure Laterality Date  . ANKLE SURGERY  2001  . APPENDECTOMY  2011  . CHOLECYSTECTOMY    . KNEE SURGERY  2006  . TONSILLECTOMY  1996    Family History  Problem Relation Age of Onset  . Colon cancer Neg Hx     Social  History   Tobacco Use  . Smoking status: Never Smoker  . Smokeless tobacco: Never Used  Substance Use Topics  . Alcohol use: Yes    Alcohol/week: 0.0 standard drinks    Comment: occasional  . Drug use: No    Current Outpatient Medications  Medication Sig Dispense Refill  . acetic acid 2 % otic solution Place 4 drops into both ears daily as needed (swimming). 15 mL 1  . dicyclomine (BENTYL) 10 MG capsule Take 1 capsule (10 mg total) by mouth 4 (four) times daily -  before meals and at bedtime. 120 capsule 1  . EPINEPHrine 0.3 mg/0.3 mL IJ SOAJ injection Inject into the muscle.    . levocetirizine (XYZAL) 5 MG tablet Take 1 tablet (5 mg total) by mouth every evening. 30 tablet 12  . levothyroxine (SYNTHROID, LEVOTHROID) 112 MCG tablet Take 1 tablet (112 mcg total) by mouth daily before breakfast. 90 tablet 3  . Multiple Vitamin (MULTIVITAMIN) capsule Take 1 capsule by mouth.    . Omega-3 1000 MG CAPS Take 1 g by mouth.    . WHEY PROTEIN PO Take 26 g/day by mouth.     No current facility-administered medications for this visit.     Allergies  Allergen Reactions  . Sodium Metabisulfite Shortness Of Breath and Swelling  . Sulfamethoxazole Hives  . Sulfites Anaphylaxis  . Codeine Nausea And Vomiting  . Cephalexin Nausea Only  unsure Abdominal pain, nausea and fever   . Sulfa Antibiotics Rash    unknown    Review of Systems:  Constitutional: Denies fever, chills, diaphoresis, appetite change and fatigue.  HEENT: Denies photophobia, eye pain, redness, hearing loss, ear pain, congestion, sore throat, rhinorrhea, sneezing, mouth sores, neck pain, neck stiffness and tinnitus.   Respiratory: Denies SOB, DOE, cough, chest tightness,  and wheezing.   Cardiovascular: Denies chest pain, palpitations and leg swelling.  Genitourinary: Denies dysuria, urgency, frequency, hematuria, flank pain and difficulty urinating.  Musculoskeletal: Denies myalgias, back pain, joint swelling,  arthralgias and gait problem.  Skin: No rash.  Neurological: Denies dizziness, seizures, syncope, weakness, light-headedness, numbness and headaches.  Hematological: Denies adenopathy. Easy bruising, personal or family bleeding history  Psychiatric/Behavioral: No anxiety or depression     Physical Exam:    LMP 01/25/2019  There were no vitals filed for this visit. Constitutional:  Well-developed, in no acute distress. Psychiatric: Normal mood and affect. Behavior is normal. televisit Data Reviewed: I have personally reviewed following labs and imaging studies  CBC: CBC Latest Ref Rng & Units 01/29/2019 01/26/2019 07/22/2017  WBC 3.8 - 10.8 Thousand/uL 6.2 6.3 9.4  Hemoglobin 11.7 - 15.5 g/dL 78.212.3 95.612.6 21.311.8  Hematocrit 35.0 - 45.0 % 36.8 37.4 35.5  Platelets 140 - 400 Thousand/uL 323 332 358    CMP: CMP Latest Ref Rng & Units 01/29/2019 01/26/2019 07/22/2017  Glucose 65 - 99 mg/dL 96 086(V100(H) 89  BUN 7 - 25 mg/dL 12 12 20   Creatinine 0.50 - 1.10 mg/dL 7.841.00 6.960.99 2.951.03  Sodium 135 - 146 mmol/L 138 137 137  Potassium 3.5 - 5.3 mmol/L 4.9 4.4 4.2  Chloride 98 - 110 mmol/L 102 100 101  CO2 20 - 32 mmol/L 26 28 28   Calcium 8.6 - 10.2 mg/dL 9.7 9.8 9.6  Total Protein 6.1 - 8.1 g/dL 7.2 7.4 7.1  Total Bilirubin 0.2 - 1.2 mg/dL 0.5 0.7 0.4  Alkaline Phos 25 - 125 U/L - - -  AST 10 - 30 U/L 12 15 20   ALT 6 - 29 U/L 7 10 13     GFR: CrCl cannot be calculated (Unknown ideal weight.). Liver Function Tests: Recent Labs  Lab 01/29/19 1047  AST 12  ALT 7  BILITOT 0.5  PROT 7.2   Recent Labs  Lab 01/29/19 1047  LIPASE 19     Recent Results (from the past 240 hour(s))  Novel Coronavirus, NAA ([facility])     Status: None   Collection Time: 01/30/19  9:55 AM  Result Value Ref Range Status   SARS-CoV-2, NAA Not Detected Not Detected Final    Comment: This test was developed and its performance characteristics determined by [facility]. This test has not been FDA cleared or  approved. This test has been authorized by FDA under an Emergency Use Authorization (EUA). This test is only authorized for the duration of time the declaration that circumstances exist justifying the authorization of the emergency use of in vitro diagnostic tests for detection of SARS-CoV-2 virus and/or diagnosis of COVID-19 infection under section 564(b)(1) of the Act, 21 U.S.C. 284XLK-4(M)(0360bbb-3(b)(1), unless the authorization is terminated or revoked sooner. When diagnostic testing is negative, the possibility of a false negative result should be considered in the context of a patient's recent exposures and the presence of clinical signs and symptoms consistent with COVID-19. An individual without symptoms of COVID-19 and who is not shedding SARS-CoV-2 virus would expect to have a negative (not detected) result in  this assay.       Radiology Studies: Dg Chest 2 View  Result Date: 01/29/2019 CLINICAL DATA:  Left lower quadrant pain EXAM: CHEST - 2 VIEW COMPARISON:  09/14/2017 FINDINGS: The heart size and mediastinal contours are within normal limits. Both lungs are clear. Gentle levoscoliosis of the thoracic spine. IMPRESSION: No acute abnormality of the lungs. Electronically Signed   By: Lauralyn PrimesAlex  Bibbey M.D.   On: 01/29/2019 14:16   Ct Abdomen Pelvis W Contrast  Result Date: 01/26/2019 CLINICAL DATA:  Abdominal pain  primarily left-sided, and diarrhea EXAM: CT ABDOMEN AND PELVIS WITH CONTRAST TECHNIQUE: Multidetector CT imaging of the abdomen and pelvis was performed using the standard protocol following bolus administration of intravenous contrast. Oral contrast was also administered. CONTRAST:  100mL ISOVUE-300 IOPAMIDOL (ISOVUE-300) INJECTION 61% COMPARISON:  None. FINDINGS: Lower chest: Lung bases are clear. Hepatobiliary: No focal liver lesions are appreciable. Gallbladder is absent. There is no biliary duct dilatation. Pancreas: There is no pancreatic mass or inflammatory focus. Spleen: There is  a cyst in the periphery of the spleen measuring 7 x 7 mm. No other splenic lesions are evident. Adrenals/Urinary Tract: Adrenals bilaterally appear unremarkable. Kidneys bilaterally show no evident mass or hydronephrosis on either side. There is no evident renal or ureteral calculus on either side. Urinary bladder is midline with wall thickness within normal limits. Stomach/Bowel: There is no appreciable bowel wall or mesenteric thickening. There is no appreciable bowel obstruction. Terminal ileum appears within normal limits. There is no evident free air or portal venous air. Vascular/Lymphatic: There is no abdominal aortic aneurysm. No vascular lesions are demonstrable. No adenopathy is evident in the abdomen or pelvis by size criteria. Note that there are several subcentimeter lymph nodes in the right mid to lower abdomen. Reproductive: The uterus is retroverted. There is no evident pelvic mass. Tampon present. Other: The appendix is absent. There is no periappendiceal region inflammation. There is no evident abscess or ascites in the abdomen or pelvis. Musculoskeletal: There are no blastic or lytic bone lesions. There is no intramuscular or abdominal wall lesion. IMPRESSION: 1. Several subcentimeter lymph nodes are noted in the right mid to lower abdomen. These lymph nodes are regarded as nonspecific. In the appropriate clinical setting, these lymph nodes may be indicative of mesenteric adenitis. No adenopathy evident by size criteria. 2. No evident bowel obstruction. No abscess in the abdomen or pelvis. Appendix absent. 3. No evident renal or ureteral calculus. No hydronephrosis. Urinary bladder wall thickness is within normal limits. Electronically Signed   By: Bretta BangWilliam  Woodruff III M.D.   On: 01/26/2019 16:16   This service was provided via telemedicine.  The patient was located at home.  The provider was located in office.  The patient did consent to this telephone visit and is aware of possible charges  through their insurance for this visit.  The patient was referred by Dr. Denyse Amassorey.   Time spent on call/coordination of care: 45 min    Edman Circleaj Tion Tse, MD 02/02/2019, 1:53 PM  Cc: Rodolph Bongorey, Evan S, MD

## 2019-02-03 ENCOUNTER — Encounter: Payer: Self-pay | Admitting: Gastroenterology

## 2019-02-03 ENCOUNTER — Ambulatory Visit (AMBULATORY_SURGERY_CENTER): Payer: BC Managed Care – PPO | Admitting: Gastroenterology

## 2019-02-03 ENCOUNTER — Telehealth: Payer: Self-pay | Admitting: Family Medicine

## 2019-02-03 VITALS — BP 101/51 | HR 63 | Temp 98.3°F | Resp 16 | Ht 67.0 in | Wt 159.0 lb

## 2019-02-03 DIAGNOSIS — R1012 Left upper quadrant pain: Secondary | ICD-10-CM

## 2019-02-03 DIAGNOSIS — K3189 Other diseases of stomach and duodenum: Secondary | ICD-10-CM

## 2019-02-03 DIAGNOSIS — K2970 Gastritis, unspecified, without bleeding: Secondary | ICD-10-CM

## 2019-02-03 MED ORDER — SODIUM CHLORIDE 0.9 % IV SOLN: 500.0000 mL | Freq: Once | INTRAVENOUS | Status: AC

## 2019-02-03 MED ORDER — DEXLANSOPRAZOLE 30 MG PO CPDR: 30.0000 mg | DELAYED_RELEASE_CAPSULE | Freq: Every day | ORAL | 1 refills | Status: DC

## 2019-02-03 NOTE — Progress Notes (Signed)
Pt. Reports no change in her medical or surgical history since her pre-visit 02/02/2019.  Temp checked by CW.  Vitals checked by JB.

## 2019-02-03 NOTE — Telephone Encounter (Signed)
Left a message asking how the she is doing and for a return call.

## 2019-02-03 NOTE — Patient Instructions (Signed)
Handouts given:  Dexilant coupon  YOU HAD AN ENDOSCOPIC PROCEDURE TODAY AT [facility]:   Refer to the procedure report that was given to you for any specific questions about what was found during the examination.  If the procedure report does not answer your questions, please call your gastroenterologist to clarify.  If you requested that your care partner not be given the details of your procedure findings, then the procedure report has been included in a sealed envelope for you to review at your convenience later.  YOU SHOULD EXPECT: Some feelings of bloating in the abdomen. Passage of more gas than usual.  Walking can help get rid of the air that was put into your GI tract during the procedure and reduce the bloating. If you had a lower endoscopy (such as a colonoscopy or flexible sigmoidoscopy) you may notice spotting of blood in your stool or on the toilet paper. If you underwent a bowel prep for your procedure, you may not have a normal bowel movement for a few days.  Please Note:  You might notice some irritation and congestion in your nose or some drainage.  This is from the oxygen used during your procedure.  There is no need for concern and it should clear up in a day or so.  SYMPTOMS TO REPORT IMMEDIATELY:   Following upper endoscopy (EGD)  Vomiting of blood or coffee ground material  New chest pain or pain under the shoulder blades  Painful or persistently difficult swallowing  New shortness of breath  Fever of 100F or higher  Black, tarry-looking stools  For urgent or emergent issues, a gastroenterologist can be reached at any hour by calling [phone].   DIET:  We do recommend a small meal at first, but then you may proceed to your regular diet.  Drink plenty of fluids but you should avoid alcoholic beverages for 24 hours.  ACTIVITY:  You should plan to take it easy for the rest of today and you should NOT DRIVE or use heavy machinery until tomorrow  (because of the sedation medicines used during the test).    FOLLOW UP: Our staff will call the number listed on your records 48-72 hours following your procedure to check on you and address any questions or concerns that you may have regarding the information given to you following your procedure. If we do not reach you, we will leave a message.  We will attempt to reach you two times.  During this call, we will ask if you have developed any symptoms of COVID 19. If you develop any symptoms (ie: fever, flu-like symptoms, shortness of breath, cough etc.) before then, please call [phone].  If you test positive for Covid 19 in the 2 weeks post procedure, please call and report this information to [place].    If any biopsies were taken you will be contacted by phone or by letter within the next 1-3 weeks.  Please call us at [phone] if you have not heard about the biopsies in 3 weeks.    SIGNATURES/CONFIDENTIALITY: You and/or your care partner have signed paperwork which will be entered into your electronic medical record.  These signatures attest to the fact that that the information above on your After Visit Summary has been reviewed and is understood.  Full responsibility of the confidentiality of this discharge information lies with you and/or your care-partner.

## 2019-02-03 NOTE — Op Note (Signed)
[facility] Patient Name: Cynthia EndoKelly Minkler Procedure Date: 02/03/2019 9:04 AM MRN: [id number] Endoscopist: Lynann Bolognaajesh Jil Penland , MD Age: 33 Referring MD:  Date of Birth: 03/26/1986 Gender: Female Account #: [id number] Procedure:                Upper GI endoscopy Indications:              Abdominal pain in the left upper quadrant Medicines:                Monitored Anesthesia Care Procedure:                Pre-Anesthesia Assessment:                           - Prior to the procedure, a History and Physical                            was performed, and patient medications and                            allergies were reviewed. The patient's tolerance of                            previous anesthesia was also reviewed. The risks                            and benefits of the procedure and the sedation                            options and risks were discussed with the patient.                            All questions were answered, and informed consent                            was obtained. Prior Anticoagulants: The patient has                            taken no previous anticoagulant or antiplatelet                            agents. ASA Grade Assessment: I - A normal, healthy                            patient. After reviewing the risks and benefits,                            the patient was deemed in satisfactory condition to                            undergo the procedure.                           After obtaining informed consent, the endoscope was  passed under direct vision. Throughout the                            procedure, the patient's blood pressure, pulse, and                            oxygen saturations were monitored continuously. The                            Model GIF-[id number]) scope was introduced                            through the mouth, and advanced to the second part                            of duodenum. The upper GI  endoscopy was                            accomplished without difficulty. The patient                            tolerated the procedure well. Scope In: Scope Out: Findings:                 The examined esophagus was normal.                           The Z-line was regular and was found 35 cm from the                            incisors.                           Diffuse moderate inflammation characterized by                            congestion (edema) and erythema was found in the                            entire examined stomach. Biopsies were taken with a                            cold forceps for histology. Estimated blood loss:                            none.                           The examined duodenum was normal. Biopsies for                            histology were taken with a cold forceps for                            evaluation of celiac disease. Estimated blood loss:  none. Complications:            No immediate complications. Estimated Blood Loss:     Estimated blood loss: none. Impression:               -Moderate gastritis.                           -Otherwise normal EGD. Recommendation:           - Patient has a contact number available for                            emergencies. The signs and symptoms of potential                            delayed complications were discussed with the                            patient. Return to normal activities tomorrow.                            Written discharge instructions were provided to the                            patient.                           - Resume previous diet.                           - Follow biopsies.                           - Use Dexilant (dexlansoprazole) 30 mg PO daily for                            8 weeks.                           - No aspirin, ibuprofen, naproxen, or other                            non-steroidal anti-inflammatory drugs.                            - Return to GI clinic in 6 weeks. Jackquline Denmark, MD 02/03/2019 9:27:54 AM This report has been signed electronically.

## 2019-02-03 NOTE — Progress Notes (Signed)
Temp taken by Courtney Washington, CMA, VS taken by Judy Branson, CMA. 

## 2019-02-03 NOTE — Progress Notes (Signed)
PT taken to PACU. Monitors in place. VSS. Report given to RN. 

## 2019-02-03 NOTE — Progress Notes (Signed)
Called to room to assist during endoscopic procedure.  Patient ID and intended procedure confirmed with present staff. Received instructions for my participation in the procedure from the performing physician.  

## 2019-02-03 NOTE — Telephone Encounter (Signed)
Please ask patient how she is doing.  See that she had an evaluation with gastroenterology yesterday and is planning for upper endoscopy.

## 2019-02-05 ENCOUNTER — Telehealth: Payer: Self-pay | Admitting: *Deleted

## 2019-02-05 ENCOUNTER — Telehealth: Payer: Self-pay

## 2019-02-05 NOTE — Telephone Encounter (Signed)
Opened in error

## 2019-02-05 NOTE — Telephone Encounter (Signed)
  Follow up Call-  Call back number 02/03/2019  Post procedure Call Back phone  # [phone]  Permission to leave phone message Yes  Some recent data might be hidden     Patient questions:  Do you have a fever, pain , or abdominal swelling? No. Pain Score  0 *  Have you tolerated food without any problems? Yes.    Have you been able to return to your normal activities? Yes.    Do you have any questions about your discharge instructions: Diet   No. Medications  No. Follow up visit  No.  Do you have questions or concerns about your Care? No.  1. Have you developed a fever since your procedure? no  2.   Have you had an respiratory symptoms (SOB or cough) since your procedure? no  3.   Have you tested positive for COVID 19 since your procedure no  4.   Have you had any family members/close contacts diagnosed with the COVID 19 since your procedure?  no   If yes to any of these questions please route to Joylene John, RN and Alphonsa Gin, Therapist, sports.

## 2019-02-11 ENCOUNTER — Encounter: Payer: Self-pay | Admitting: Gastroenterology

## 2019-03-12 ENCOUNTER — Other Ambulatory Visit: Payer: Self-pay | Admitting: Family Medicine

## 2019-03-16 ENCOUNTER — Ambulatory Visit: Payer: BC Managed Care – PPO | Admitting: Gastroenterology

## 2019-03-30 ENCOUNTER — Other Ambulatory Visit: Payer: Self-pay | Admitting: Gastroenterology

## 2019-04-01 ENCOUNTER — Telehealth: Payer: Self-pay

## 2019-04-01 NOTE — Telephone Encounter (Signed)
Pt returned call and answered "NO" to all of the Covid-19 screening questions °

## 2019-04-01 NOTE — Telephone Encounter (Signed)
Covid-19 screening questions   Do you now or have you had a fever in the last 14 days?  Do you have any respiratory symptoms of shortness of breath or cough now or in the last 14 days?  Do you have any family members or close contacts with diagnosed or suspected Covid-19 in the past 14 days?  Have you been tested for Covid-19 and found to be positive?   Called and left voicemail for patient to call us back to answer questions     

## 2019-04-02 ENCOUNTER — Other Ambulatory Visit: Payer: Self-pay

## 2019-04-02 ENCOUNTER — Ambulatory Visit: Payer: BC Managed Care – PPO | Admitting: Gastroenterology

## 2019-04-02 ENCOUNTER — Encounter: Payer: Self-pay | Admitting: Gastroenterology

## 2019-04-02 VITALS — BP 110/80 | HR 92 | Temp 98.4°F | Ht 66.0 in | Wt 155.2 lb

## 2019-04-02 DIAGNOSIS — R1012 Left upper quadrant pain: Secondary | ICD-10-CM

## 2019-04-02 MED ORDER — DEXILANT 30 MG PO CPDR: 30.0000 mg | DELAYED_RELEASE_CAPSULE | Freq: Every day | ORAL | 6 refills | Status: DC

## 2019-04-02 NOTE — Progress Notes (Signed)
Chief Complaint: Abdominal pain  Referring Provider:  Rodolph Bongorey, Evan S, MD      ASSESSMENT AND PLAN;   #1. LUQ pain (resolved). 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  EGD 01/2019 with moderate gastritis. Neg SB Bx for celiac, neg HP. Failed trial of Protonix, Bentyl and Carafate. Nl CBC, CMP and lipase. Responded well to dexilant.  #2.  H/O PUD on EGD 2014.  Plan: -Dexilant 30mg  po qd #30, 6 refiils. Can taper dexilant 60mg  po QOD and stop thereafter. -Call if any problems in future or questions.    HPI:    Cynthia Jennings is a 33 y.o. 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  No nausea, vomiting, heartburn, regurgitation, odynophagia or dysphagia.  No significant diarrhea or constipation.  There is no melena or hematochezia. No unintentional weight loss.  Good appetite.  Pleased with progress.   Past GI procedures:  -EGD 01/2019- see report. 2014 showing peptic ulcer disease/duodenitis. -Colonoscopy 2011 before appendicectomy-negative for Crohn's.   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  Physically very active Past Medical History:  Diagnosis Date  . Hypothyroid     Past Surgical History:  Procedure Laterality Date  . ANKLE SURGERY  2001  . APPENDECTOMY  2011  . CHOLECYSTECTOMY    . KNEE SURGERY  2006  . TONSILLECTOMY  1996    Family History  Problem Relation Age of Onset  . Colon cancer Neg Hx     Social History   Tobacco Use  . Smoking status: Never Smoker  . Smokeless tobacco: Never Used  Substance Use Topics  . Alcohol use: Yes    Alcohol/week: 0.0 standard drinks    Comment: occasional  . Drug use: No    Current Outpatient Medications  Medication Sig Dispense Refill  . acetic acid 2 % otic solution Place 4 drops into both ears daily as needed (swimming). 15 mL 1  . calcium-vitamin D (OSCAL WITH D) 500-200 MG-UNIT tablet Take 1 tablet by mouth.    . DEXILANT 30 MG capsule TAKE 1  CAPSULE BY MOUTH EVERY DAY 30 capsule 1  . fluticasone (FLONASE) 50 MCG/ACT nasal spray Place into both nostrils daily.    Marland Kitchen. levocetirizine (XYZAL) 5 MG tablet TAKE 1 TABLET(5 MG) BY MOUTH EVERY EVENING 30 tablet 12  . levothyroxine (SYNTHROID) 112 MCG tablet TAKE 1 TABLET(112 MCG) BY MOUTH DAILY BEFORE BREAKFAST 90 tablet 3  . Multiple Vitamin (MULTIVITAMIN) capsule Take 1 capsule by mouth.    . Omega-3 1000 MG CAPS Take 1 g by mouth.    . WHEY PROTEIN PO Take 26 g/day by mouth.    . EPINEPHrine 0.3 mg/0.3 mL IJ SOAJ injection Inject into the muscle.     Current Facility-Administered Medications  Medication Dose Route Frequency Provider Last Rate Last Dose  . 0.9 %  sodium chloride infusion  500 mL Intravenous Once Lynann BolognaGupta, Zanylah Hardie, MD        Allergies  Allergen Reactions  . Sodium Metabisulfite Shortness Of Breath and Swelling  . Sulfamethoxazole Hives  . Sulfites Anaphylaxis  . Codeine Nausea And Vomiting  . Cephalexin Nausea Only    unsure Abdominal pain, nausea and fever   . Sulfa Antibiotics Rash    unknown    Review of Systems:  neg     Physical Exam:    BP 110/80   Pulse 92   Temp 98.4 F (36.9 C)   Ht 5\' 6"  (  1.676 m)   Wt 155 lb 4 oz (70.4 kg)   BMI 25.06 kg/m  Filed Weights   04/02/19 0825  Weight: 155 lb 4 oz (70.4 kg)   Constitutional:  Well-developed, in no acute distress. Psychiatric: Normal mood and affect. Behavior is normal. Abdo-soft nontender.  Bowel sounds are present. Data Reviewed: I have personally reviewed following labs and imaging studies  CBC: CBC Latest Ref Rng & Units 01/29/2019 01/26/2019 07/22/2017  WBC 3.8 - 10.8 Thousand/uL 6.2 6.3 9.4  Hemoglobin 11.7 - 15.5 g/dL 12.3 12.6 11.8  Hematocrit 35.0 - 45.0 % 36.8 37.4 35.5  Platelets 140 - 400 Thousand/uL 323 332 358    CMP: CMP Latest Ref Rng & Units 01/29/2019 01/26/2019 07/22/2017  Glucose 65 - 99 mg/dL 96 100(H) 89  BUN 7 - 25 mg/dL 12 12 20   Creatinine 0.50 - 1.10 mg/dL 1.00 0.99  1.03  Sodium 135 - 146 mmol/L 138 137 137  Potassium 3.5 - 5.3 mmol/L 4.9 4.4 4.2  Chloride 98 - 110 mmol/L 102 100 101  CO2 20 - 32 mmol/L 26 28 28   Calcium 8.6 - 10.2 mg/dL 9.7 9.8 9.6  Total Protein 6.1 - 8.1 g/dL 7.2 7.4 7.1  Total Bilirubin 0.2 - 1.2 mg/dL 0.5 0.7 0.4  Alkaline Phos 25 - 125 U/L - - -  AST 10 - 30 U/L 12 15 20   ALT 6 - 29 U/L 7 10 13        Carmell Austria, MD 04/02/2019, 8:43 AM  Cc: Gregor Hams, MD

## 2019-04-02 NOTE — Patient Instructions (Signed)
If you are age 32 or older, your body mass index should be between 23-30. Your Body mass index is 25.06 kg/m. If this is out of the aforementioned range listed, please consider follow up with your Primary Care Provider.  If you are age 64 or younger, your body mass index should be between 19-25. Your Body mass index is 25.06 kg/m. If this is out of the aformentioned range listed, please consider follow up with your Primary Care Provider.   We have sent the following medications to your pharmacy for you to pick up at your convenience: Dexilant  Thank you,  Dr. Jackquline Denmark

## 2019-08-09 IMAGING — CT CT ABDOMEN AND PELVIS WITH CONTRAST
2 of 4 series · 16 of 46 positions shown, 18 images · IV contrast (iopamidol)
Comparison: None.

CLINICAL DATA: Abdominal pain  primarily left-sided, and diarrhea

EXAM:
CT ABDOMEN AND PELVIS WITH CONTRAST
TECHNIQUE: Multidetector CT imaging of the abdomen and pelvis was performed
using the standard protocol following bolus administration of
intravenous contrast. Oral contrast was also administered.
CONTRAST:  100mL [id number] IOPAMIDOL ([id number]) INJECTION 61%

[Series 2: axial st · axial · 0.88mm/px · z∈[-513,-118]mm · 13 of 87 slices shown, 15 images]
[im 4/87  soft-tissue]
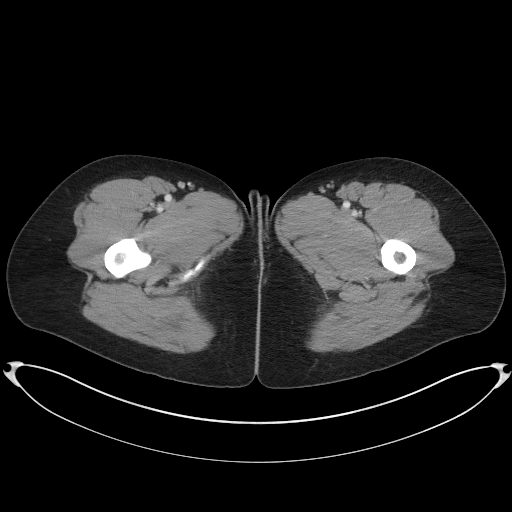
[im 4/87  bone]
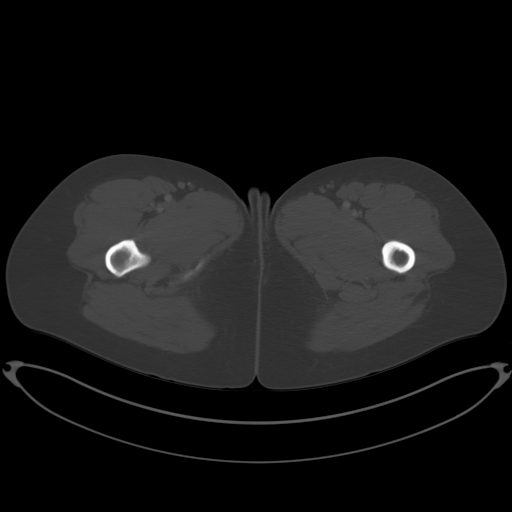
[im 11/87  soft-tissue]
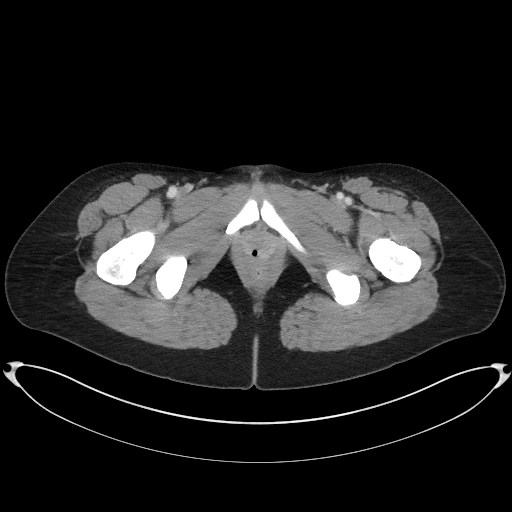
[im 18/87  soft-tissue]
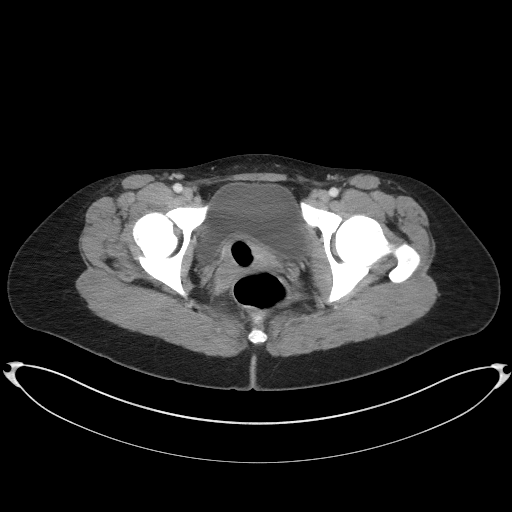
[im 26/87  soft-tissue]
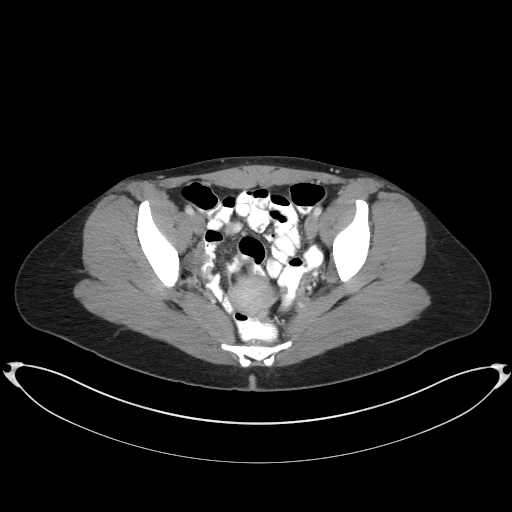
[im 29/87  soft-tissue]
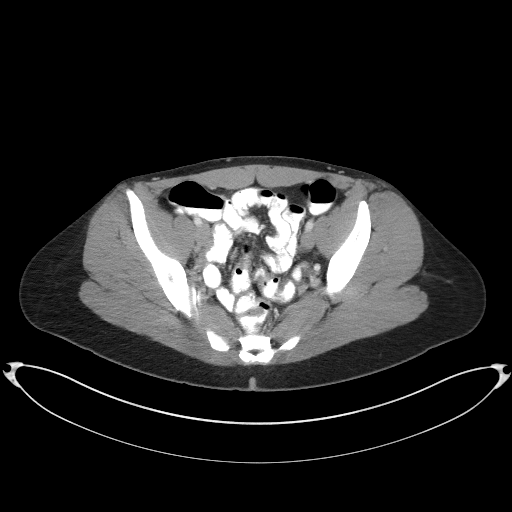
[im 36/87  soft-tissue]
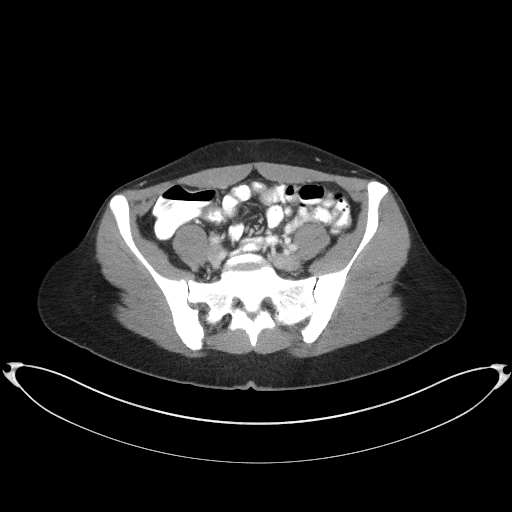
[im 44/87  soft-tissue]
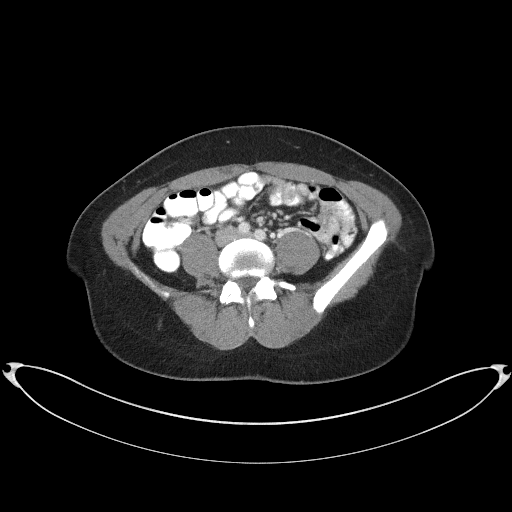
[im 51/87  soft-tissue]
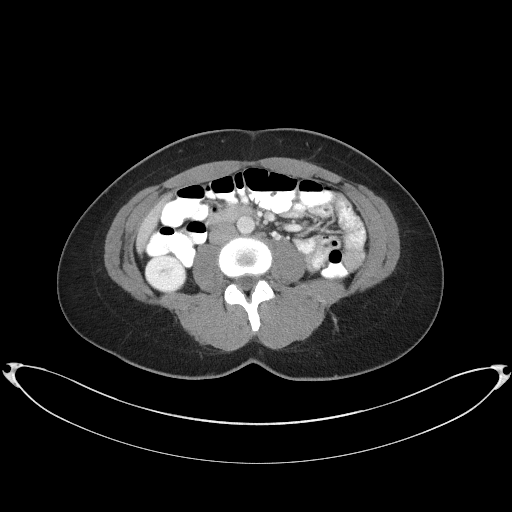
[im 58/87  soft-tissue]
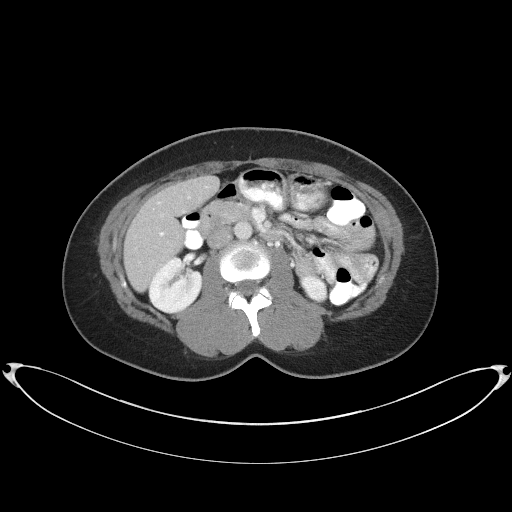
[im 58/87  bone]
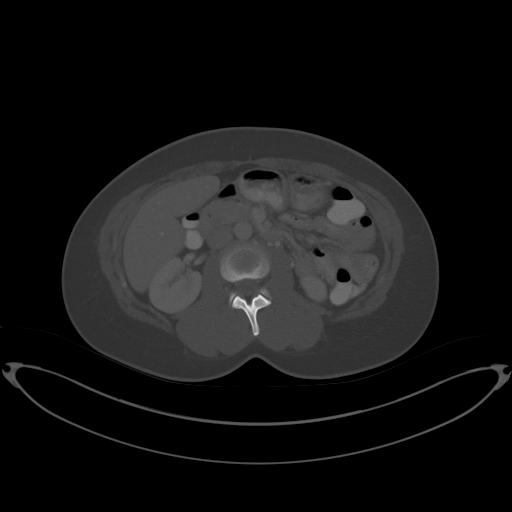
[im 61/87  soft-tissue]
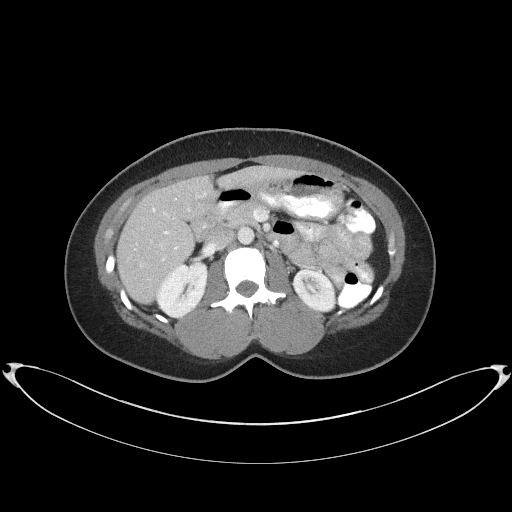
[im 69/87  soft-tissue]
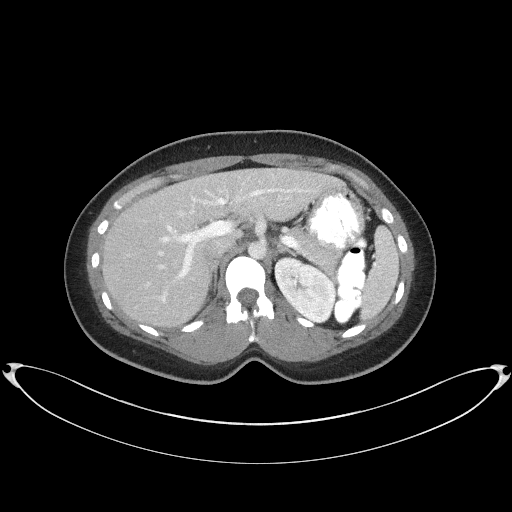
[im 76/87  soft-tissue]
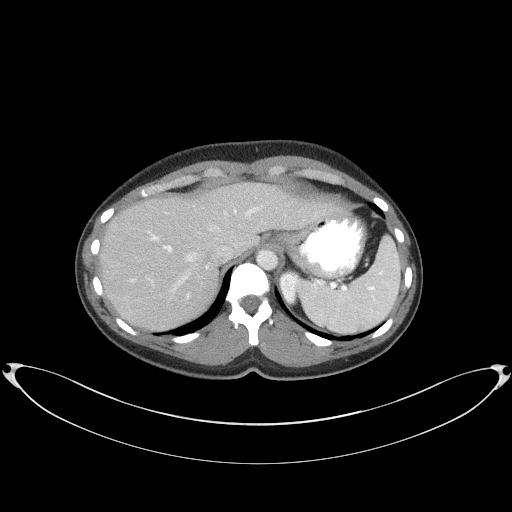
[im 83/87  soft-tissue]
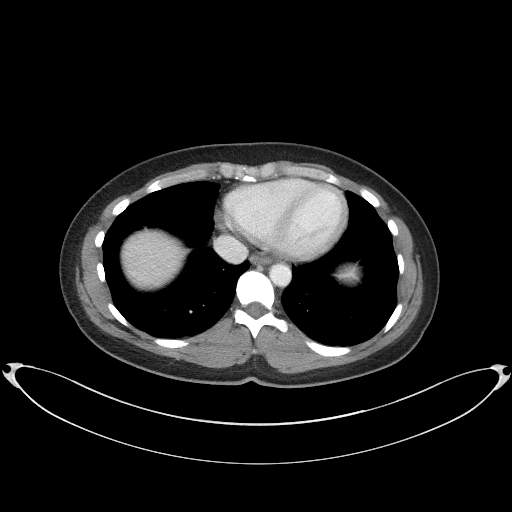

[Series 5: coronal st · coronal · 0.83mm/px · 3 of 87 slices shown]
[im 29/87  soft-tissue]
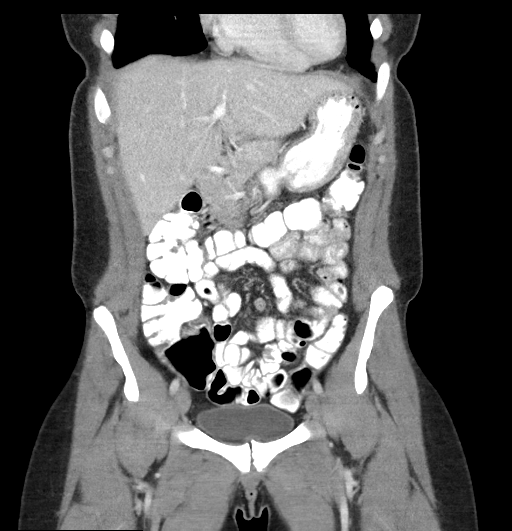
[im 39/87  soft-tissue]
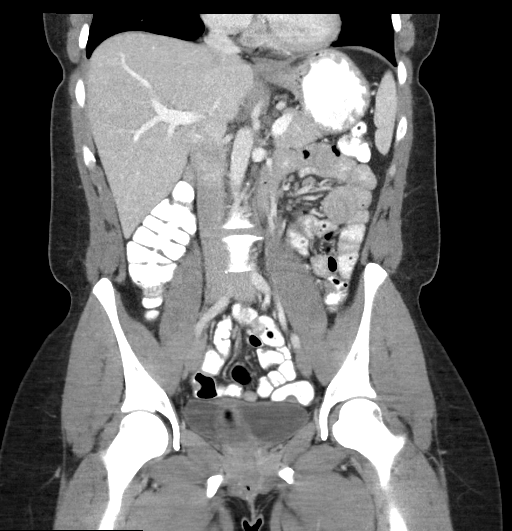
[im 48/87  soft-tissue]
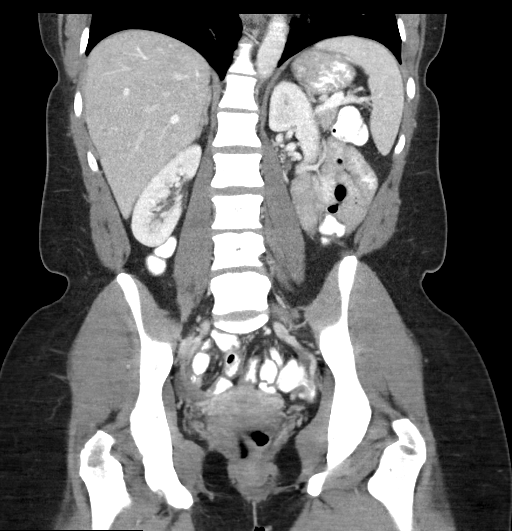

[16 of 46 positions shown; findings below may reference images not displayed]

FINDINGS: Lower chest: Lung bases are clear.

Hepatobiliary: No focal liver lesions are appreciable. Gallbladder
is absent. There is no biliary duct dilatation.

Pancreas: There is no pancreatic mass or inflammatory focus.

Spleen: There is a cyst in the periphery of the spleen measuring 7 x
7 mm. No other splenic lesions are evident.

Adrenals/Urinary Tract: Adrenals bilaterally appear unremarkable.
Kidneys bilaterally show no evident mass or hydronephrosis on either
side. There is no evident renal or ureteral calculus on either side.
Urinary bladder is midline with wall thickness within normal limits.

Stomach/Bowel: There is no appreciable bowel wall or mesenteric
thickening. There is no appreciable bowel obstruction. Terminal
ileum appears within normal limits. There is no evident free air or
portal venous air.

Vascular/Lymphatic: There is no abdominal aortic aneurysm. No
vascular lesions are demonstrable. No adenopathy is evident in the
abdomen or pelvis by size criteria. Note that there are several
subcentimeter lymph nodes in the right mid to lower abdomen.

Reproductive: The uterus is retroverted. There is no evident pelvic
mass. Tampon present.

Other: The appendix is absent. There is no periappendiceal region
inflammation. There is no evident abscess or ascites in the abdomen
or pelvis.

Musculoskeletal: There are no blastic or lytic bone lesions. There
is no intramuscular or abdominal wall lesion.
IMPRESSION: 1. Several subcentimeter lymph nodes are noted in the right mid to
lower abdomen. These lymph nodes are regarded as nonspecific. In the
appropriate clinical setting, these lymph nodes may be indicative of
mesenteric adenitis. No adenopathy evident by size criteria.

2. No evident bowel obstruction. No abscess in the abdomen or
pelvis. Appendix absent.

3. No evident renal or ureteral calculus. No hydronephrosis. Urinary
bladder wall thickness is within normal limits.

## 2019-08-12 IMAGING — DX CHEST - 2 VIEW
2 series · 2 of 2 positions shown · non-contrast
Comparison: 09/14/2017

CLINICAL DATA: Left lower quadrant pain

EXAM:
CHEST - 2 VIEW

[chest pa]
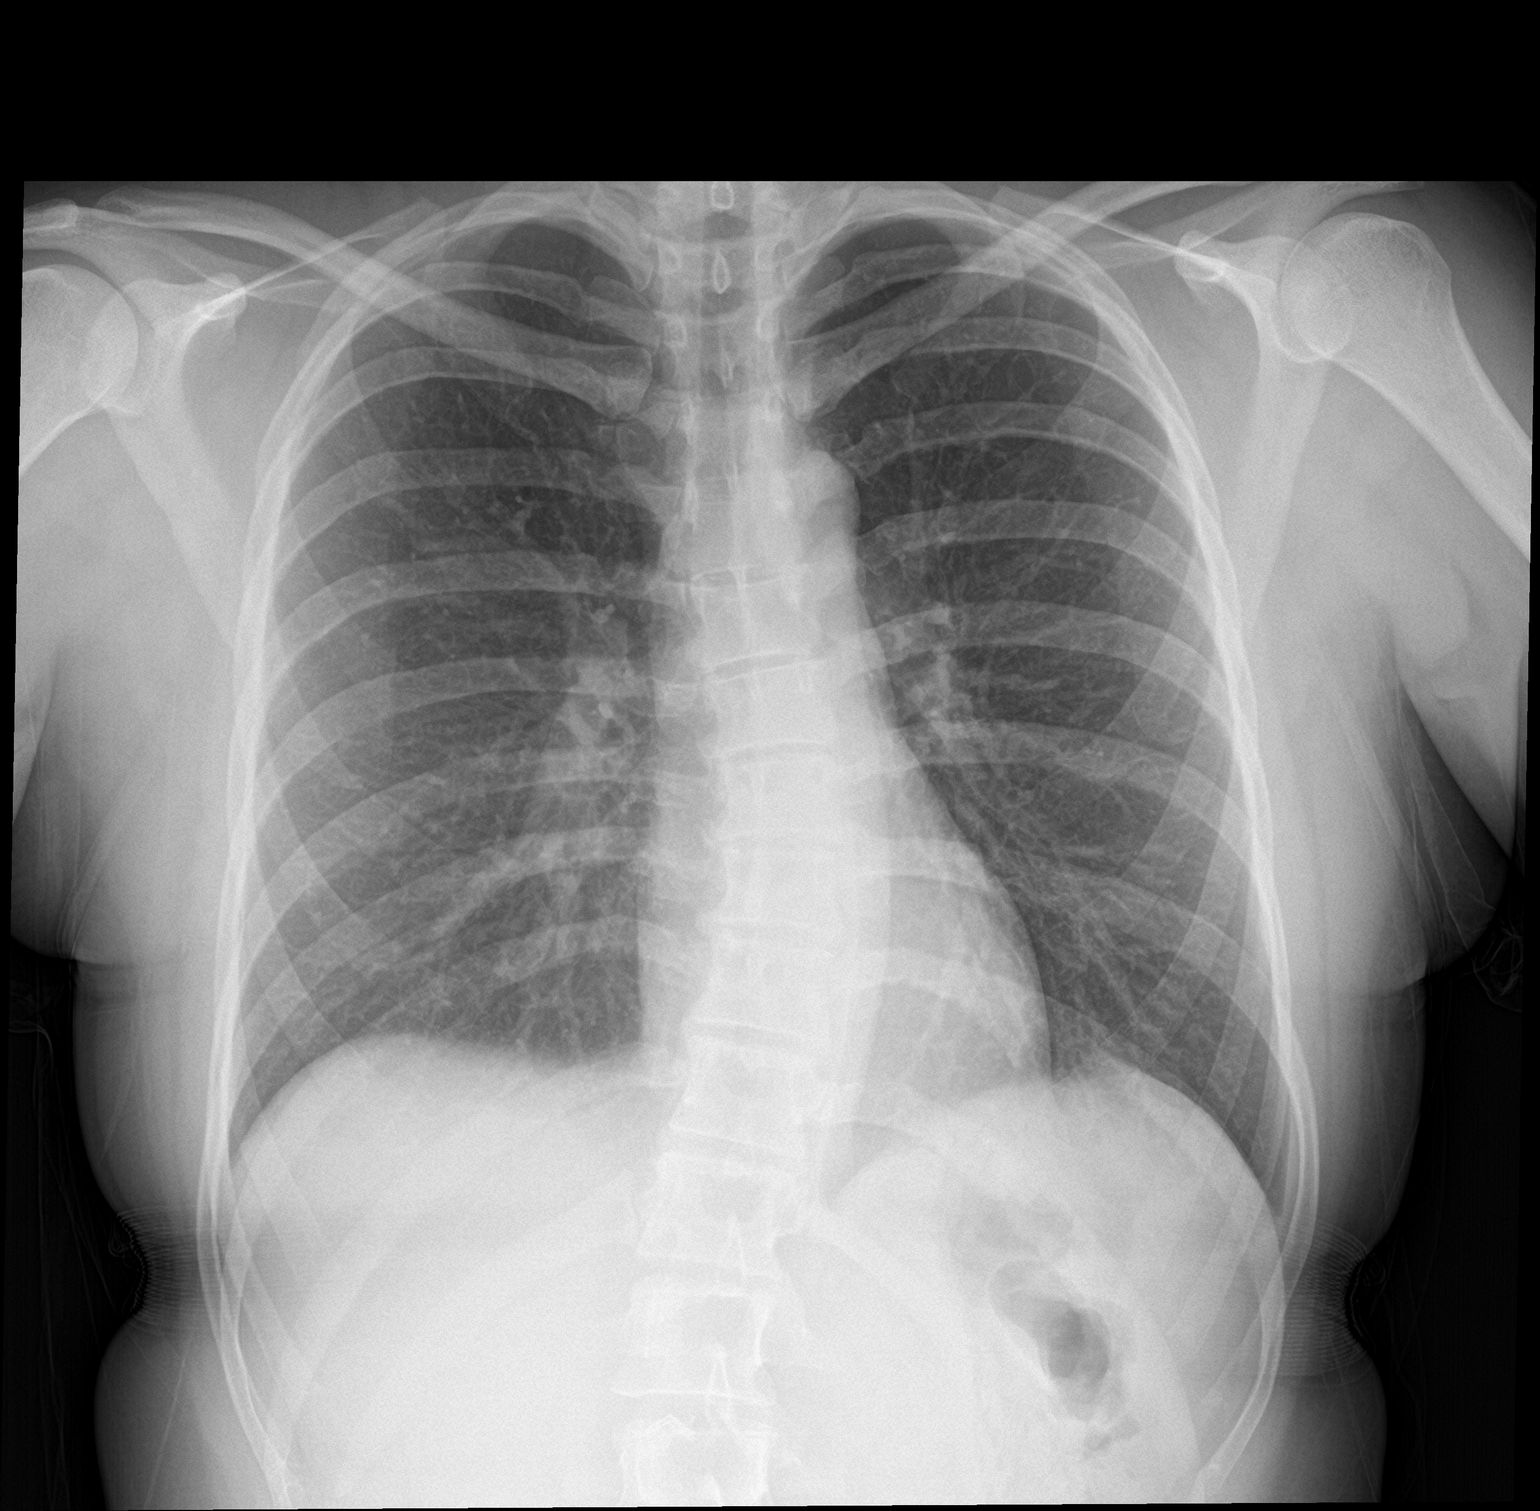

[chest lat]
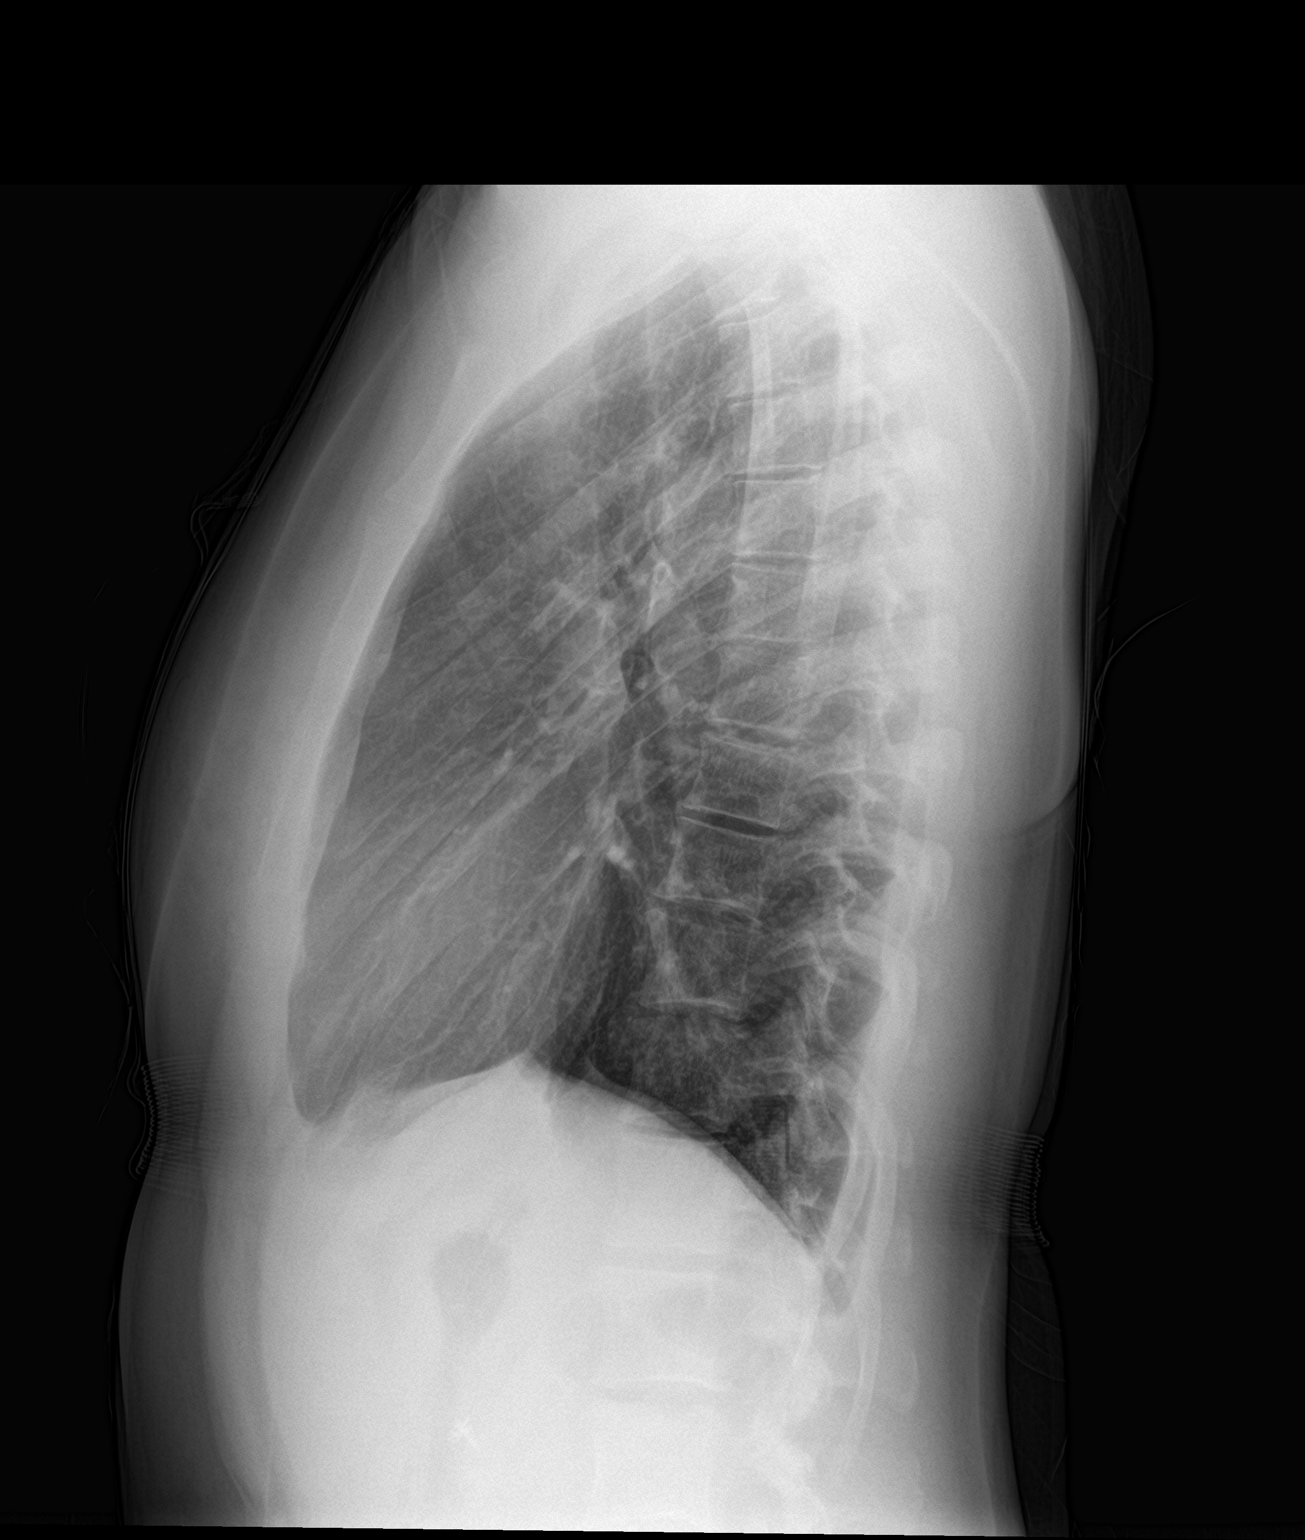

[2 of 2 positions shown; findings below may reference images not displayed]

FINDINGS: The heart size and mediastinal contours are within normal limits.
Both lungs are clear. Gentle levoscoliosis of the thoracic spine.
IMPRESSION: No acute abnormality of the lungs.

## 2019-11-01 ENCOUNTER — Ambulatory Visit: Payer: BC Managed Care – PPO | Attending: Internal Medicine

## 2019-11-01 DIAGNOSIS — Z23 Encounter for immunization: Secondary | ICD-10-CM | POA: Insufficient documentation

## 2019-11-01 NOTE — Progress Notes (Signed)
   Covid-19 Vaccination Clinic  Name:  Cynthia Jennings    MRN: [id number] DOB: Feb 08, 1986  11/01/2019  [name] was observed post Covid-19 immunization for 30 minutes based on pre-vaccination screening without incident. She was provided with Vaccine Information Sheet and instruction to access the V-Safe system.   [name] was instructed to call 911 with any severe reactions post vaccine: Marland Kitchen Difficulty breathing  . Swelling of face and throat  . A fast heartbeat  . A bad rash all over body  . Dizziness and weakness   Immunizations Administered    Name Date Dose VIS Date Route   Pfizer COVID-19 Vaccine 11/01/2019  4:27 PM 0.3 mL 08/07/2019 Intramuscular   Manufacturer: [employer], [employer]   Lot: [id number]   NDC: [address]-3

## 2019-12-01 ENCOUNTER — Ambulatory Visit: Payer: BC Managed Care – PPO | Attending: Internal Medicine

## 2019-12-01 DIAGNOSIS — Z23 Encounter for immunization: Secondary | ICD-10-CM

## 2019-12-01 NOTE — Progress Notes (Signed)
   Covid-19 Vaccination Clinic  Name:  Cynthia Jennings    MRN: [id number] DOB: February 28, 1986  12/01/2019  [name] was observed post Covid-19 immunization for 30 minutes based on pre-vaccination screening without incident. She was provided with Vaccine Information Sheet and instruction to access the V-Safe system.   [name] was instructed to call 911 with any severe reactions post vaccine: Marland Kitchen Difficulty breathing  . Swelling of face and throat  . A fast heartbeat  . A bad rash all over body  . Dizziness and weakness   Immunizations Administered    Name Date Dose VIS Date Route   Pfizer COVID-19 Vaccine 12/01/2019  1:52 PM 0.3 mL 08/07/2019 Intramuscular   Manufacturer: [employer], [employer]   Lot: [id number]   NDC: [address]-7

## 2020-02-08 ENCOUNTER — Telehealth: Payer: Self-pay

## 2020-02-08 NOTE — Telephone Encounter (Signed)
Patient has an appointment 03/21/20. She does yearly thyroid panel check up.

## 2020-03-04 NOTE — Telephone Encounter (Signed)
error 

## 2020-03-15 ENCOUNTER — Other Ambulatory Visit: Payer: Self-pay | Admitting: Family Medicine

## 2020-03-21 ENCOUNTER — Encounter: Payer: Self-pay | Admitting: Osteopathic Medicine

## 2020-03-21 ENCOUNTER — Ambulatory Visit (INDEPENDENT_AMBULATORY_CARE_PROVIDER_SITE_OTHER): Payer: BC Managed Care – PPO | Admitting: Osteopathic Medicine

## 2020-03-21 ENCOUNTER — Other Ambulatory Visit: Payer: Self-pay

## 2020-03-21 VITALS — BP 121/85 | HR 71 | Temp 98.0°F | Ht 66.0 in | Wt 149.0 lb

## 2020-03-21 DIAGNOSIS — Z0000 Encounter for general adult medical examination without abnormal findings: Secondary | ICD-10-CM

## 2020-03-21 DIAGNOSIS — J309 Allergic rhinitis, unspecified: Secondary | ICD-10-CM | POA: Diagnosis not present

## 2020-03-21 DIAGNOSIS — K219 Gastro-esophageal reflux disease without esophagitis: Secondary | ICD-10-CM | POA: Diagnosis not present

## 2020-03-21 DIAGNOSIS — E039 Hypothyroidism, unspecified: Secondary | ICD-10-CM

## 2020-03-21 LAB — CBC
HCT: 36.2 % (ref 35.0–45.0)
Hemoglobin: 11.6 g/dL — ABNORMAL LOW (ref 11.7–15.5)
MCH: 27.8 pg (ref 27.0–33.0)
MCHC: 32 g/dL (ref 32.0–36.0)
MCV: 86.8 fL (ref 80.0–100.0)
MPV: 10.1 fL (ref 7.5–12.5)
Platelets: 295 10*3/uL (ref 140–400)
RBC: 4.17 10*6/uL (ref 3.80–5.10)
RDW: 12 % (ref 11.0–15.0)
WBC: 5.5 10*3/uL (ref 3.8–10.8)

## 2020-03-21 LAB — COMPLETE METABOLIC PANEL WITH GFR
AG Ratio: 1.9 (calc) (ref 1.0–2.5)
ALT: 17 U/L (ref 6–29)
AST: 25 U/L (ref 10–30)
Albumin: 4.3 g/dL (ref 3.6–5.1)
Alkaline phosphatase (APISO): 47 U/L (ref 31–125)
BUN/Creatinine Ratio: 24 (calc) — ABNORMAL HIGH (ref 6–22)
BUN: 29 mg/dL — ABNORMAL HIGH (ref 7–25)
CO2: 28 mmol/L (ref 20–32)
Calcium: 9 mg/dL (ref 8.6–10.2)
Chloride: 103 mmol/L (ref 98–110)
Creat: 1.2 mg/dL — ABNORMAL HIGH (ref 0.50–1.10)
GFR, Est African American: 69 mL/min/{1.73_m2} (ref >=60)
GFR, Est Non African American: 59 mL/min/{1.73_m2} — ABNORMAL LOW (ref >=60)
Globulin: 2.3 g/dL (calc) (ref 1.9–3.7)
Glucose, Bld: 74 mg/dL (ref 65–139)
Potassium: 4.5 mmol/L (ref 3.5–5.3)
Sodium: 139 mmol/L (ref 135–146)
Total Bilirubin: 0.5 mg/dL (ref 0.2–1.2)
Total Protein: 6.6 g/dL (ref 6.1–8.1)

## 2020-03-21 LAB — LIPID PANEL
Cholesterol: 159 mg/dL (ref <200)
HDL: 78 mg/dL (ref >=50)
LDL Cholesterol (Calc): 68 mg/dL (calc)
Non-HDL Cholesterol (Calc): 81 mg/dL (calc) (ref <130)
Total CHOL/HDL Ratio: 2 (calc) (ref <5.0)
Triglycerides: 46 mg/dL (ref <150)

## 2020-03-21 LAB — TSH: TSH: 2.35 mIU/L

## 2020-03-21 MED ORDER — FLUTICASONE PROPIONATE 50 MCG/ACT NA SUSP: 2.0000 | Freq: Every day | NASAL | 99 refills | Status: DC

## 2020-03-21 MED ORDER — ACETIC ACID 2 % OT SOLN: 4.0000 [drp] | Freq: Every day | OTIC | 1 refills | Status: DC | PRN

## 2020-03-21 MED ORDER — LEVOTHYROXINE SODIUM 112 MCG PO TABS: ORAL_TABLET | ORAL | 3 refills | Status: DC

## 2020-03-21 MED ORDER — LEVOCETIRIZINE DIHYDROCHLORIDE 5 MG PO TABS: 5.0000 mg | ORAL_TABLET | Freq: Every day | ORAL | 99 refills | Status: DC

## 2020-03-21 NOTE — Progress Notes (Signed)
Cynthia Jennings is a 34 y.o. female who presents to  [facility] at [facility]  today, 03/21/20, seeking care for the following:  . Annual physical      ASSESSMENT & PLAN with other pertinent findings:  The primary encounter diagnosis was Annual physical exam. Diagnoses of Hypothyroidism, unspecified type, Allergic rhinitis, unspecified seasonality, unspecified trigger, and Gastroesophageal reflux disease, unspecified whether esophagitis present were also pertinent to this visit.   No results found for this or any previous visit (from the past 24 hour(s)).     Patient Instructions  General Preventive Care  Most recent routine screening labs: ordered today.   Blood pressure goal 130/80 or less.   Tobacco: don't!  Alcohol: responsible moderation is ok for most adults - if you have concerns about your alcohol intake, please talk to me!   Exercise: as tolerated to reduce risk of cardiovascular disease and diabetes. Strength training will also prevent osteoporosis.   Mental health: if need for mental health care (medicines, counseling, other), or concerns about moods, please let me know!   Sexual / Reproductive health: if need for STD testing, or if concerns with libido/pain problems, please let me know! If you need to discuss family planning, please let me know!   Advanced Directive: Living Will and/or Healthcare Power of Attorney recommended for all adults, regardless of age or health.  Vaccines  Flu vaccine: for almost everyone, every fall.   Shingles vaccine: after age 66.   Pneumonia vaccines: after age 36  Tetanus booster: every 10 years (due 2029) / 3rd trimester of pregnancy  HPV vaccine: Gardasil up to age 68   COVID vaccine: THANKS for getting your vaccine! :)  Cancer screenings   Colon cancer screening: for everyone age 66-75. Colonoscopy available for all, many people also qualify for the Cologuard stool test   Breast  cancer screening: mammogram at age 67  Cervical cancer screening: Pap due 03/2023  Lung cancer screening: not needed for non-smokers  Infection screenings  . HIV: recommended screening at least once age 49-65 . Gonorrhea/Chlamydia: screening as needed . Hepatitis C: recommended once for everyone age 11-75 . TB: certain at-risk populations, or depending on work requirements and/or travel history Other . Bone Density Test: recommended for women at age 10   Orders Placed This Encounter  Procedures  . CBC  . COMPLETE METABOLIC PANEL WITH GFR  . Lipid panel  . TSH    Meds ordered this encounter  Medications  . acetic acid 2 % otic solution    Sig: Place 4 drops into both ears daily as needed (swimming).    Dispense:  15 mL    Refill:  1  . levothyroxine (SYNTHROID) 112 MCG tablet    Sig: TAKE 1 TABLET(112 MCG) BY MOUTH DAILY BEFORE BREAKFAST    Dispense:  90 tablet    Refill:  3  . levocetirizine (XYZAL) 5 MG tablet    Sig: Take 1 tablet (5 mg total) by mouth daily.    Dispense:  90 tablet    Refill:  99  . fluticasone (FLONASE) 50 MCG/ACT nasal spray    Sig: Place 2 sprays into both nostrils daily.    Dispense:  48 mL    Refill:  99    BP 121/85 (BP Location: Left Arm, Patient Position: Sitting)   Pulse 71   Temp 98 F (36.7 C)   Ht 5\' 6"  (1.676 m)   Wt 149 lb (67.6 kg)  SpO2 100%   BMI 24.05 kg/m  Constitutional:  . VSS, see nurse notes . General Appearance: alert, well-developed, well-nourished, NAD Eyes: Marland Kitchen Normal lids and conjunctive, non-icteric sclera Neck: . No masses, trachea midline . No thyroid enlargement/tenderness/mass appreciated Respiratory: . Normal respiratory effor . Breath sounds normal, no wheeze/rhonchi/rales Cardiovascular: . S1/S2 normal, no murmur/rub/gallop auscultated . No lower extremity edema Gastrointestinal: . Nontender, no masses . No hepatomegaly, no splenomegaly . No hernia appreciated Musculoskeletal:  . Gait  normal . No clubbing/cyanosis of digits Neurological: . No cranial nerve deficit on limited exam . Motor and sensation intact and symmetric Psychiatric: . Normal judgment/insight . Normal mood and affect     Follow-up instructions: Return in about 1 year (around 03/21/2021) for [place] (call week prior to visit for lab orders).                                          No outpatient medications have been marked as taking for the 03/21/20 encounter (Office Visit) with Sunnie Nielsen, DO.   Current Facility-Administered Medications for the 03/21/20 encounter (Office Visit) with Sunnie Nielsen, DO  Medication  . 0.9 %  sodium chloride infusion    No results found for this or any previous visit (from the past 72 hour(s)).  No results found.     All questions at time of visit were answered - patient instructed to contact office with any additional concerns or updates.  ER/RTC precautions were reviewed with the patient as applicable.   Please note: voice recognition software was used to produce this document, and typos may escape review. Please contact Dr. Lyn Hollingshead for any needed clarifications.

## 2020-03-21 NOTE — Patient Instructions (Addendum)
General Preventive Care  Most recent routine screening labs: ordered today.   Blood pressure goal 130/80 or less.   Tobacco: don't!  Alcohol: responsible moderation is ok for most adults - if you have concerns about your alcohol intake, please talk to me!   Exercise: as tolerated to reduce risk of cardiovascular disease and diabetes. Strength training will also prevent osteoporosis.   Mental health: if need for mental health care (medicines, counseling, other), or concerns about moods, please let me know!   Sexual / Reproductive health: if need for STD testing, or if concerns with libido/pain problems, please let me know! If you need to discuss family planning, please let me know!   Advanced Directive: Living Will and/or Healthcare Power of Attorney recommended for all adults, regardless of age or health.  Vaccines  Flu vaccine: for almost everyone, every fall.   Shingles vaccine: after age 19.   Pneumonia vaccines: after age 25  Tetanus booster: every 10 years (due 2029) / 3rd trimester of pregnancy  HPV vaccine: Gardasil up to age 55   COVID vaccine: THANKS for getting your vaccine! :)  Cancer screenings   Colon cancer screening: for everyone age 65-75. Colonoscopy available for all, many people also qualify for the Cologuard stool test   Breast cancer screening: mammogram at age 36  Cervical cancer screening: Pap due 03/2023  Lung cancer screening: not needed for non-smokers  Infection screenings  . HIV: recommended screening at least once age 16-65 . Gonorrhea/Chlamydia: screening as needed . Hepatitis C: recommended once for everyone age 29-75 . TB: certain at-risk populations, or depending on work requirements and/or travel history Other . Bone Density Test: recommended for women at age 9

## 2020-03-29 ENCOUNTER — Other Ambulatory Visit: Payer: Self-pay | Admitting: Family Medicine

## 2021-03-07 ENCOUNTER — Other Ambulatory Visit: Payer: Self-pay

## 2021-03-07 ENCOUNTER — Ambulatory Visit (INDEPENDENT_AMBULATORY_CARE_PROVIDER_SITE_OTHER): Payer: BC Managed Care – PPO | Admitting: Osteopathic Medicine

## 2021-03-07 ENCOUNTER — Encounter: Payer: Self-pay | Admitting: Osteopathic Medicine

## 2021-03-07 VITALS — BP 107/81 | HR 74 | Ht 66.0 in | Wt 146.0 lb

## 2021-03-07 DIAGNOSIS — N911 Secondary amenorrhea: Secondary | ICD-10-CM | POA: Diagnosis not present

## 2021-03-07 DIAGNOSIS — Z0000 Encounter for general adult medical examination without abnormal findings: Secondary | ICD-10-CM

## 2021-03-07 DIAGNOSIS — D649 Anemia, unspecified: Secondary | ICD-10-CM

## 2021-03-07 DIAGNOSIS — E039 Hypothyroidism, unspecified: Secondary | ICD-10-CM

## 2021-03-07 NOTE — Progress Notes (Signed)
Cynthia Jennings is a 35 y.o. female who presents to  [facility] at [facility]  today, 03/07/21, seeking care for the following:  No periods since 09/2020 Just finished a bodybuilding event and has been back to usual eating habits about a month Requesting labs for hormone levels       ASSESSMENT & PLAN with other pertinent findings:  The primary encounter diagnosis was Annual physical exam. Diagnoses of Secondary amenorrhea, Hypothyroidism, unspecified type, and Anemia, unspecified type were also pertinent to this visit.   If no period after 3 mos of "normal" diet/activity please let me know, would consider progesterone challenge vs OBGYN referral. No risk of pregnancy.   Patient Instructions  General Preventive Care Most recent routine screening labs: ordered now.  Blood pressure goal 130/80 or less.  Tobacco: don't!  Alcohol: responsible moderation is ok for most adults - if you have concerns about your alcohol intake, please talk to me!  Exercise: as tolerated to reduce risk of cardiovascular disease and diabetes. Strength training will also prevent osteoporosis.  Mental health: if need for mental health care (medicines, counseling, other), or concerns about moods, please let me know!  Sexual / Reproductive health: if need for STI testing, or if concerns with libido/pain problems, please let me know! If you need to discuss family planning, please let me know!  Advanced Directive: Living Will and/or Healthcare Power of Attorney recommended for all adults, regardless of age or health.  Vaccines Flu vaccine: for almost everyone, every fall.  Shingles vaccine: after age 49.  Pneumonia vaccines: after age 59. Tetanus booster: every 10 years until 2029 COVID vaccine: THANKS for getting your vaccine! :)  Cancer screenings  Colon cancer screening: for everyone age 50-75.  Breast cancer screening: mammogram at age 52  Cervical cancer screening:  Pap due 2024 Lung cancer screening: not needed for non-smokers  Infection screenings  HIV: recommended screening at least once age 8-65, more often as needed. Gonorrhea/Chlamydia: screening as needed. Hepatitis C: recommended once for everyone age 9-75 TB: certain at-risk populations, or depending on work requirements and/or travel history Other Bone Density Test: recommended at age 66  Orders Placed This Encounter  Procedures   CBC   COMPLETE METABOLIC PANEL WITH GFR   TSH   T4, free   Follicle stimulating hormone   Prolactin   FSH/LH   Estradiol   Testosterone   Progesterone   Lipid panel   Testosterone, Total, LC/MS/MS    No orders of the defined types were placed in this encounter.    See below for relevant physical exam findings  See below for recent lab and imaging results reviewed  Medications, allergies, PMH, PSH, SocH, FamH reviewed below    Follow-up instructions: Return in about 1 year (around 03/07/2022) for [place] (call week prior to visit for lab orders), if still no period by mid-September let me know! .                                        Exam:  BP 107/81   Pulse 74   Ht 5\' 6"  (1.676 m)   Wt 146 lb (66.2 kg)   SpO2 100%   BMI 23.57 kg/m  Constitutional: VS see above. General Appearance: alert, well-developed, well-nourished, NAD Neck: No masses, trachea midline.  Respiratory: Normal respiratory effort. no wheeze, no rhonchi, no rales Cardiovascular: S1/S2  normal, no murmur, no rub/gallop auscultated. RRR.  Musculoskeletal: Gait normal. Symmetric and independent movement of all extremities Abdominal: non-tender, non-distended, no appreciable organomegaly, neg Murphy's, BS WNLx4 Neurological: Normal balance/coordination. No tremor. Skin: warm, dry, intact.  Psychiatric: Normal judgment/insight. Normal mood and affect. Oriented x3.   Current Meds  Medication Sig   acetic acid 2 % otic solution Place 4  drops into both ears daily as needed (swimming).   Cholecalciferol (VITAMIN D) 50 MCG (2000 UT) CAPS Take 1 capsule by mouth daily.   EPINEPHrine 0.3 mg/0.3 mL IJ SOAJ injection Inject into the muscle.   fluticasone (FLONASE) 50 MCG/ACT nasal spray Place 2 sprays into both nostrils daily.   levocetirizine (XYZAL) 5 MG tablet Take 1 tablet (5 mg total) by mouth daily.   levothyroxine (SYNTHROID) 112 MCG tablet TAKE 1 TABLET BY MOUTH DAILY BEFORE BREAKFAST.   Multiple Vitamin (MULTIVITAMIN) capsule Take 1 capsule by mouth.   Omega-3 1000 MG CAPS Take 1 g by mouth.   WHEY PROTEIN PO Take 26 g/day by mouth.   Current Facility-Administered Medications for the 03/07/21 encounter (Office Visit) with Sunnie Nielsen, DO  Medication   0.9 %  sodium chloride infusion    Allergies  Allergen Reactions   Sodium Metabisulfite Shortness Of Breath and Swelling   Sulfamethoxazole Hives   Sulfites Anaphylaxis   Codeine Nausea And Vomiting   Cephalexin Nausea Only    unsure Abdominal pain, nausea and fever    Sulfa Antibiotics Rash    unknown    Patient Active Problem List   Diagnosis Date Noted   Anemia 02/19/2017   Seasonal allergic rhinitis due to pollen 02/19/2017   Conductive hearing loss of right ear with unrestricted hearing of left ear 10/05/2016   Chronic reactive otitis externa of right ear 10/05/2016   Olecranon bursitis of right elbow 07/17/2016   Food allergy 04/11/2016   History of Bell's palsy 09/02/2014   History of GI bleed 08/13/2014   Hypothyroidism 08/10/2014   Ovarian cyst 07/01/2009    Family History  Problem Relation Age of Onset   Colon cancer Neg Hx     Social History   Tobacco Use  Smoking Status Never  Smokeless Tobacco Never    Past Surgical History:  Procedure Laterality Date   ANKLE SURGERY  2001   APPENDECTOMY  2011   CHOLECYSTECTOMY     KNEE SURGERY  2006   TONSILLECTOMY  1996    Immunization History  Administered Date(s) Administered    DT (Pediatric) 01/12/2002   Hepatitis B 05/12/1998   Hepatitis B, adult 05/12/1998   Influenza,inj,Quad PF,6+ Mos 05/29/2017, 06/03/2019   Influenza-Unspecified 04/28/2015, 05/27/2017   PFIZER(Purple Top)SARS-COV-2 Vaccination 11/01/2019, 12/01/2019   Tdap 10/25/2017    Recent Results (from the past 2160 hour(s))  CBC     Status: None   Collection Time: 03/07/21 12:00 AM  Result Value Ref Range   WBC 9.9 3.8 - 10.8 Thousand/uL   RBC 4.48 3.80 - 5.10 Million/uL   Hemoglobin 13.2 11.7 - 15.5 g/dL   HCT 38.3 33.8 - 32.9 %   MCV 91.7 80.0 - 100.0 fL   MCH 29.5 27.0 - 33.0 pg   MCHC 32.1 32.0 - 36.0 g/dL   RDW 19.1 66.0 - 60.0 %   Platelets 238 140 - 400 Thousand/uL   MPV 11.1 7.5 - 12.5 fL  TSH     Status: Abnormal   Collection Time: 03/07/21 12:00 AM  Result Value Ref Range   TSH 5.74 (  H) mIU/L    Comment:           Reference Range .           > or = 20 Years  0.40-4.50 .                Pregnancy Ranges           First trimester    0.26-2.66           Second trimester   0.55-2.73           Third trimester    0.43-2.91   T4, free     Status: None   Collection Time: 03/07/21 12:00 AM  Result Value Ref Range   Free T4 1.2 0.8 - 1.8 ng/dL    No results found.     All questions at time of visit were answered - patient instructed to contact office with any additional concerns or updates. ER/RTC precautions were reviewed with the patient as applicable.   Please note: manual typing as well as voice recognition software may have been used to produce this document - typos may escape review. Please contact Dr. Lyn Hollingshead for any needed clarifications.

## 2021-03-07 NOTE — Patient Instructions (Addendum)
General Preventive Care Most recent routine screening labs: ordered now.  Blood pressure goal 130/80 or less.  Tobacco: don't!  Alcohol: responsible moderation is ok for most adults - if you have concerns about your alcohol intake, please talk to me!  Exercise: as tolerated to reduce risk of cardiovascular disease and diabetes. Strength training will also prevent osteoporosis.  Mental health: if need for mental health care (medicines, counseling, other), or concerns about moods, please let me know!  Sexual / Reproductive health: if need for STI testing, or if concerns with libido/pain problems, please let me know! If you need to discuss family planning, please let me know!  Advanced Directive: Living Will and/or Healthcare Power of Attorney recommended for all adults, regardless of age or health.  Vaccines Flu vaccine: for almost everyone, every fall.  Shingles vaccine: after age 34.  Pneumonia vaccines: after age 65. Tetanus booster: every 10 years until 2029 COVID vaccine: THANKS for getting your vaccine! :)  Cancer screenings  Colon cancer screening: for everyone age 68-75.  Breast cancer screening: mammogram at age 59  Cervical cancer screening: Pap due 2024 Lung cancer screening: not needed for non-smokers  Infection screenings  HIV: recommended screening at least once age 36-65, more often as needed. Gonorrhea/Chlamydia: screening as needed. Hepatitis C: recommended once for everyone age 29-75 TB: certain at-risk populations, or depending on work requirements and/or travel history Other Bone Density Test: recommended at age 64

## 2021-03-09 ENCOUNTER — Ambulatory Visit: Payer: BC Managed Care – PPO | Admitting: Osteopathic Medicine

## 2021-03-09 LAB — FSH/LH
FSH: 4.6 m[IU]/mL
LH: 3 m[IU]/mL

## 2021-03-09 LAB — LIPID PANEL
Cholesterol: 175 mg/dL (ref <200)
HDL: 100 mg/dL (ref >=50)
LDL Cholesterol (Calc): 63 mg/dL (calc)
Non-HDL Cholesterol (Calc): 75 mg/dL (calc) (ref <130)
Total CHOL/HDL Ratio: 1.8 (calc) (ref <5.0)
Triglycerides: 41 mg/dL (ref <150)

## 2021-03-09 LAB — ESTRADIOL: Estradiol: 67 pg/mL

## 2021-03-09 LAB — COMPLETE METABOLIC PANEL WITH GFR
AG Ratio: 1.9 (calc) (ref 1.0–2.5)
ALT: 61 U/L — ABNORMAL HIGH (ref 6–29)
AST: 57 U/L — ABNORMAL HIGH (ref 10–30)
Albumin: 5 g/dL (ref 3.6–5.1)
Alkaline phosphatase (APISO): 55 U/L (ref 31–125)
BUN/Creatinine Ratio: 37 (calc) — ABNORMAL HIGH (ref 6–22)
BUN: 46 mg/dL — ABNORMAL HIGH (ref 7–25)
CO2: 28 mmol/L (ref 20–32)
Calcium: 9.7 mg/dL (ref 8.6–10.2)
Chloride: 100 mmol/L (ref 98–110)
Creat: 1.25 mg/dL — ABNORMAL HIGH (ref 0.50–0.97)
Globulin: 2.6 g/dL (calc) (ref 1.9–3.7)
Glucose, Bld: 84 mg/dL (ref 65–99)
Potassium: 4.8 mmol/L (ref 3.5–5.3)
Sodium: 138 mmol/L (ref 135–146)
Total Bilirubin: 0.4 mg/dL (ref 0.2–1.2)
Total Protein: 7.6 g/dL (ref 6.1–8.1)
eGFR: 58 mL/min/{1.73_m2} — ABNORMAL LOW (ref >=60)

## 2021-03-09 LAB — CBC
HCT: 41.1 % (ref 35.0–45.0)
Hemoglobin: 13.2 g/dL (ref 11.7–15.5)
MCH: 29.5 pg (ref 27.0–33.0)
MCHC: 32.1 g/dL (ref 32.0–36.0)
MCV: 91.7 fL (ref 80.0–100.0)
MPV: 11.1 fL (ref 7.5–12.5)
Platelets: 238 10*3/uL (ref 140–400)
RBC: 4.48 10*6/uL (ref 3.80–5.10)
RDW: 12.7 % (ref 11.0–15.0)
WBC: 9.9 10*3/uL (ref 3.8–10.8)

## 2021-03-09 LAB — PROLACTIN: Prolactin: 12.6 ng/mL

## 2021-03-09 LAB — T4, FREE: Free T4: 1.2 ng/dL (ref 0.8–1.8)

## 2021-03-09 LAB — TSH: TSH: 5.74 mIU/L — ABNORMAL HIGH

## 2021-03-09 LAB — PROGESTERONE: Progesterone: 0.5 ng/mL

## 2021-03-09 LAB — TESTOSTERONE, TOTAL, LC/MS/MS: Testosterone, Total, LC-MS-MS: 13 ng/dL (ref 2–45)

## 2021-03-20 ENCOUNTER — Ambulatory Visit: Payer: BC Managed Care – PPO | Admitting: Osteopathic Medicine

## 2021-03-22 ENCOUNTER — Encounter: Payer: Self-pay | Admitting: Osteopathic Medicine

## 2021-03-27 ENCOUNTER — Other Ambulatory Visit: Payer: Self-pay | Admitting: Osteopathic Medicine

## 2021-03-28 ENCOUNTER — Other Ambulatory Visit: Payer: Self-pay | Admitting: Osteopathic Medicine

## 2021-04-12 ENCOUNTER — Other Ambulatory Visit: Payer: Self-pay

## 2021-04-12 DIAGNOSIS — E039 Hypothyroidism, unspecified: Secondary | ICD-10-CM

## 2021-04-14 ENCOUNTER — Other Ambulatory Visit: Payer: Self-pay | Admitting: Osteopathic Medicine

## 2021-04-14 LAB — T4, FREE: Free T4: 1.2 ng/dL (ref 0.8–1.8)

## 2021-04-14 LAB — TSH+FREE T4: TSH W/REFLEX TO FT4: 7.33 mIU/L — ABNORMAL HIGH

## 2021-04-14 MED ORDER — LEVOTHYROXINE SODIUM 125 MCG PO TABS: 125.0000 ug | ORAL_TABLET | Freq: Every day | ORAL | 0 refills | Status: DC

## 2021-06-05 ENCOUNTER — Other Ambulatory Visit: Payer: Self-pay

## 2021-06-05 DIAGNOSIS — E039 Hypothyroidism, unspecified: Secondary | ICD-10-CM

## 2021-06-05 LAB — TSH: TSH: 5.03 mIU/L — ABNORMAL HIGH

## 2021-06-05 NOTE — Progress Notes (Signed)
Ordered labs per results.  

## 2021-06-07 ENCOUNTER — Encounter: Payer: Self-pay | Admitting: Osteopathic Medicine

## 2021-06-07 ENCOUNTER — Other Ambulatory Visit: Payer: Self-pay | Admitting: Family Medicine

## 2021-06-07 DIAGNOSIS — E039 Hypothyroidism, unspecified: Secondary | ICD-10-CM

## 2021-06-07 MED ORDER — LEVOTHYROXINE SODIUM 137 MCG PO TABS: 125.0000 ug | ORAL_TABLET | Freq: Every day | ORAL | 0 refills | Status: DC

## 2021-08-04 LAB — TSH: TSH: 4.56 mIU/L — ABNORMAL HIGH

## 2021-08-05 ENCOUNTER — Other Ambulatory Visit: Payer: Self-pay | Admitting: Family Medicine

## 2021-08-06 ENCOUNTER — Other Ambulatory Visit: Payer: Self-pay | Admitting: Family Medicine

## 2021-08-06 DIAGNOSIS — E039 Hypothyroidism, unspecified: Secondary | ICD-10-CM

## 2021-08-06 MED ORDER — LEVOTHYROXINE SODIUM 150 MCG PO TABS: 150.0000 ug | ORAL_TABLET | Freq: Every day | ORAL | 0 refills | Status: DC

## 2021-09-26 ENCOUNTER — Encounter: Payer: Self-pay | Admitting: Family Medicine

## 2021-09-26 ENCOUNTER — Telehealth (INDEPENDENT_AMBULATORY_CARE_PROVIDER_SITE_OTHER): Payer: BC Managed Care – PPO | Admitting: Family Medicine

## 2021-09-26 DIAGNOSIS — U071 COVID-19: Secondary | ICD-10-CM | POA: Diagnosis not present

## 2021-09-26 NOTE — Progress Notes (Signed)
Cynthia Jennings - 36 y.o. female MRN [id number]  Date of birth: Nov 30, 1985   This visit type was conducted due to national recommendations for restrictions regarding the COVID-19 Pandemic (e.g. social distancing).  This format is felt to be most appropriate for this patient at this time.  All issues noted in this document were discussed and addressed.  No physical exam was performed (except for noted visual exam findings with Video Visits).  I discussed the limitations of evaluation and management by telemedicine and the availability of in person appointments. The patient expressed understanding and agreed to proceed.  I connected withNAME@ on 09/26/21 at  1:10 PM EST by a video enabled telemedicine application and verified that I am speaking with the correct person using two identifiers.  Present at visit: Everrett Coombe, Inez Catalina Donnell   Patient Location: Home [address] [place] [address]   Provider location:   [facility]  Chief Complaint  Patient presents with   Covid Positive    HPI  Cynthia Jennings is a 36 y.o. female who presents via audio/video conferencing for a telehealth visit today.  She reports testing positive for COVID earlier today.  Symptoms are very mild and include mild headache and sore throat.  She has not really felt significant fatigue.  She denies any respiratory symptoms including shortness of breath, wheezing or difficulty breathing.  She will like she can manage without medications at this time.  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   ROS:  A comprehensive ROS was completed and negative except as noted per HPI  Past Medical History:  Diagnosis Date   Hypothyroid     Past Surgical History:  Procedure Laterality Date   ANKLE SURGERY  2001   APPENDECTOMY  2011   CHOLECYSTECTOMY     KNEE SURGERY  2006   TONSILLECTOMY  1996    Family History  Problem Relation Age of Onset   Colon cancer Neg Hx      Social History   Socioeconomic History   Marital status: Single    Spouse name: Not on file   Number of children: Not on file   Years of education: Not on file   Highest education level: Not on file  Occupational History   Not on file  Tobacco Use   Smoking status: Never   Smokeless tobacco: Never  Vaping Use   Vaping Use: Never used  Substance and Sexual Activity   Alcohol use: Yes    Alcohol/week: 0.0 standard drinks    Comment: occasional   Drug use: No   Sexual activity: Not Currently    Partners: Female  Other Topics Concern   Not on file  Social History Narrative   Not on file   Social Determinants of Health   Financial Resource Strain: Not on file  Food Insecurity: Not on file  Transportation Needs: Not on file  Physical Activity: Not on file  Stress: Not on file  Social Connections: Not on file  Intimate Partner Violence: Not on file     Current Outpatient Medications:    acetic acid 2 % otic solution, Place 4 drops into both ears daily as needed (swimming)., Disp: 15 mL, Rfl: 1   Cholecalciferol (VITAMIN D) 50 MCG (2000 UT) CAPS, Take 1 capsule by mouth daily., Disp: , Rfl:    EPINEPHrine 0.3 mg/0.3 mL IJ SOAJ injection, Inject into the muscle., Disp: , Rfl:    fluticasone (FLONASE) 50  MCG/ACT nasal spray, SHAKE LIQUID AND USE 2 SPRAYS IN EACH NOSTRIL DAILY, Disp: 48 g, Rfl: 3   levocetirizine (XYZAL) 5 MG tablet, TAKE 1 TABLET(5 MG) BY MOUTH DAILY, Disp: 90 tablet, Rfl: PRN   levothyroxine (SYNTHROID) 150 MCG tablet, Take 1 tablet (150 mcg total) by mouth daily before breakfast. Please print on label: "Due for labs when this pill bottle is running low!", Disp: 60 tablet, Rfl: 0   Multiple Vitamin (MULTIVITAMIN) capsule, Take 1 capsule by mouth., Disp: , Rfl:    Omega-3 1000 MG CAPS, Take 1 g by mouth., Disp: , Rfl:    WHEY PROTEIN PO, Take 26 g/day by mouth., Disp: , Rfl:   Current Facility-Administered Medications:    0.9 %  sodium chloride  infusion, 500 mL, Intravenous, Once, Lynann Bologna, MD  EXAM:  VITALS per patient if applicable: Ht 5\' 6"  (1.676 m)    Wt 146 lb (66.2 kg)    BMI 23.57 kg/m   GENERAL: alert, oriented, appears well and in no acute distress  HEENT: atraumatic, conjunttiva clear, no obvious abnormalities on inspection of external nose and ears  NECK: normal movements of the head and neck  LUNGS: on inspection no signs of respiratory distress, breathing rate appears normal, no obvious gross SOB, gasping or wheezing  CV: no obvious cyanosis  MS: moves all visible extremities without noticeable abnormality  PSYCH/NEURO: pleasant and cooperative, no obvious depression or anxiety, speech and thought processing grossly intact  ASSESSMENT AND PLAN:  Discussed the following assessment and plan:  COVID-19 Recommend symptomatic and supportive measures at this time.  She will contact the clinic if she finds that symptoms are worsening at all.  Recommend remaining out of work for 5 days with return with additional 5 days of masking.     I discussed the assessment and treatment plan with the patient. The patient was provided an opportunity to ask questions and all were answered. The patient agreed with the plan and demonstrated an understanding of the instructions.   The patient was advised to call back or seek an in-person evaluation if the symptoms worsen or if the condition fails to improve as anticipated.    , DO

## 2021-09-26 NOTE — Assessment & Plan Note (Signed)
Recommend symptomatic and supportive measures at this time.  She will contact the clinic if she finds that symptoms are worsening at all.  Recommend remaining out of work for 5 days with return with additional 5 days of masking.

## 2021-09-26 NOTE — Progress Notes (Signed)
Sx's started 09/25/2021.  Current sx's: headache, sorethroat  Tested @ home x 2.

## 2021-10-10 ENCOUNTER — Telehealth: Payer: BC Managed Care – PPO | Admitting: Family Medicine

## 2021-10-12 ENCOUNTER — Other Ambulatory Visit: Payer: Self-pay | Admitting: Family Medicine

## 2021-10-12 DIAGNOSIS — E039 Hypothyroidism, unspecified: Secondary | ICD-10-CM

## 2021-10-12 LAB — TSH: TSH: 3.08 mIU/L

## 2021-10-12 MED ORDER — LEVOTHYROXINE SODIUM 150 MCG PO TABS: 150.0000 ug | ORAL_TABLET | Freq: Every day | ORAL | 1 refills | Status: DC

## 2022-02-12 ENCOUNTER — Other Ambulatory Visit: Payer: Self-pay | Admitting: Family Medicine

## 2022-02-12 DIAGNOSIS — E039 Hypothyroidism, unspecified: Secondary | ICD-10-CM

## 2022-02-13 MED ORDER — LEVOTHYROXINE SODIUM 150 MCG PO TABS: 150.0000 ug | ORAL_TABLET | Freq: Every day | ORAL | 1 refills | Status: DC

## 2022-05-02 ENCOUNTER — Encounter: Payer: Self-pay | Admitting: Family Medicine

## 2023-12-05 LAB — HM PAP SMEAR
HPV 16: NEGATIVE
HPV 18 / 45: NEGATIVE
HPV Aptima: POSITIVE

## 2024-05-12 ENCOUNTER — Telehealth: Payer: Self-pay

## 2024-05-12 NOTE — Telephone Encounter (Signed)
 Copied from CRM [id number]. Topic: General - Other >> May 12, 2024  2:32 PM Miquel SAILOR wrote: Reason for CRM: Patient verifying on if PCP takes insurance [employer] yes was speaking on others but call disconnected

## 2024-05-12 NOTE — Telephone Encounter (Signed)
 Question appears to have been answered by Miquel SAILOR. With E2C2

## 2024-06-23 ENCOUNTER — Ambulatory Visit: Admitting: Family Medicine

## 2024-06-23 VITALS — BP 102/64 | HR 78 | Ht 66.0 in | Wt 164.0 lb

## 2024-06-23 DIAGNOSIS — E039 Hypothyroidism, unspecified: Secondary | ICD-10-CM

## 2024-06-23 DIAGNOSIS — Z23 Encounter for immunization: Secondary | ICD-10-CM

## 2024-06-23 NOTE — Progress Notes (Signed)
 Cynthia Jennings - 38 y.o. female MRN [id number]  Date of birth: Jul 29, 1986  Subjective Chief Complaint  Patient presents with   Hypothyroidism    HPI Cynthia Jennings is a 38 y.o. female here today for follow up visit. Recently moved back to the area but only for a brief period of time while in between jobs.   She has history of hypothyroidism.  Compliant with levothyroxine  but needs updated labs.  Overall she feels pretty good.    ROS:  A comprehensive ROS was completed and negative except as noted per HPI  Allergies  Allergen Reactions   Sodium Metabisulfite Shortness Of Breath and Swelling   Sulfamethoxazole Hives   Sulfites Anaphylaxis   Codeine  Nausea And Vomiting   Cephalexin Nausea Only    unsure Abdominal pain, nausea and fever    Sulfa Antibiotics Rash    unknown    Past Medical History:  Diagnosis Date   Hypothyroid     Past Surgical History:  Procedure Laterality Date   ANKLE SURGERY  2001   APPENDECTOMY  2011   CHOLECYSTECTOMY     KNEE SURGERY  2006   TONSILLECTOMY  1996    Social History   Socioeconomic History   Marital status: Single    Spouse name: Not on file   Number of children: Not on file   Years of education: Not on file   Highest education level: Master's degree (e.g., MA, MS, MEng, MEd, MSW, MBA)  Occupational History   Not on file  Tobacco Use   Smoking status: Never   Smokeless tobacco: Never  Vaping Use   Vaping status: Never Used  Substance and Sexual Activity   Alcohol use: Yes    Alcohol/week: 0.0 standard drinks of alcohol    Comment: occasional   Drug use: No   Sexual activity: Not Currently    Partners: Female  Other Topics Concern   Not on file  Social History Narrative   Not on file   Social Drivers of Health   Financial Resource Strain: Low Risk  (06/23/2024)   Overall Financial Resource Strain (CARDIA)    Difficulty of Paying Living Expenses: Not hard at all  Food Insecurity: No Food Insecurity (06/23/2024)   Hunger  Vital Sign    Worried About Running Out of Food in the Last Year: Never true    Ran Out of Food in the Last Year: Never true  Transportation Needs: No Transportation Needs (06/23/2024)   PRAPARE - Administrator, Civil Service (Medical): No    Lack of Transportation (Non-Medical): No  Physical Activity: Sufficiently Active (06/23/2024)   Exercise Vital Sign    Days of Exercise per Week: 7 days    Minutes of Exercise per Session: 120 min  Stress: No Stress Concern Present (06/23/2024)   [employer] of Occupational Health - Occupational Stress Questionnaire    Feeling of Stress: Not at all  Social Connections: Moderately Isolated (06/23/2024)   Social Connection and Isolation Panel    Frequency of Communication with Friends and Family: More than three times a week    Frequency of Social Gatherings with Friends and Family: Twice a week    Attends Religious Services: Never    Database Administrator or Organizations: Yes    Attends Engineer, Structural: More than 4 times per year    Marital Status: Never married    Family History  Problem Relation Age of Onset   Colon cancer Neg Hx  Health Maintenance  Topic Date Due   Hepatitis C Screening  Never done   Cervical Cancer Screening (HPV/Pap Cotest)  04/04/2023   Hepatitis B Vaccines 19-59 Average Risk (2 of 3 - 3-dose series) 06/23/2025 (Originally 06/09/1998)   HPV VACCINES (1 - 3-dose SCDM series) 06/23/2025 (Originally 10/02/2013)   COVID-19 Vaccine (3 - 2025-26 season) 07/09/2025 (Originally 04/27/2024)   DTaP/Tdap/Td (3 - Td or Tdap) 10/26/2027   Influenza Vaccine  Completed   HIV Screening  Completed   Pneumococcal Vaccine  Aged Out   Meningococcal B Vaccine  Aged Out     ----------------------------------------------------------------------------------------------------------------------------------------------------------------------------------------------------------------- Physical Exam BP  102/64 (BP Location: Left Arm, Patient Position: Sitting, Cuff Size: Normal)   Pulse 78   Ht 5' 6 (1.676 m)   Wt 164 lb (74.4 kg)   SpO2 97%   BMI 26.47 kg/m   Physical Exam Constitutional:      Appearance: Normal appearance.  HENT:     Head: Normocephalic and atraumatic.  Neurological:     Mental Status: She is alert.  Psychiatric:        Mood and Affect: Mood normal.        Behavior: Behavior normal.     ------------------------------------------------------------------------------------------------------------------------------------------------------------------------------------------------------------------- Assessment and Plan  Hypothyroidism Recheck thyroid function tests today.  Continue on levothyroxine  at current strength until labs return.    No orders of the defined types were placed in this encounter.   No follow-ups on file.

## 2024-06-23 NOTE — Assessment & Plan Note (Signed)
 Recheck thyroid function tests today.  Continue on levothyroxine  at current strength until labs return.

## 2024-06-24 ENCOUNTER — Ambulatory Visit: Payer: Self-pay | Admitting: Family Medicine

## 2024-06-24 LAB — THYROID PANEL WITH TSH
Free Thyroxine Index: 2.4 (ref 1.2–4.9)
T3 Uptake Ratio: 32 % (ref 24–39)
T4, Total: 7.5 ug/dL (ref 4.5–12.0)
TSH: 1.59 u[IU]/mL (ref 0.450–4.500)

## 2024-06-30 NOTE — Telephone Encounter (Signed)
 Patient has been advised of results. Patient advised she had one refill left from previous provider, which she has recently filled. Patient advised she will be needing a new prescription sent to preferred pharmacy in about 30-days. But, will reach out to our office when refill is appropriate.

## 2024-08-04 ENCOUNTER — Other Ambulatory Visit: Payer: Self-pay | Admitting: Family Medicine

## 2024-08-04 NOTE — Telephone Encounter (Unsigned)
 Copied from CRM #[id number]. Topic: Clinical - Medication Refill >> Aug 04, 2024  9:45 AM Fonda T wrote: Medication: Levothyroxine  Sodium 200 MCG CAPS  Pt recently moved to [place] , still trying to establish care with new PCP in [place]   Has the patient contacted their pharmacy? Yes Advised to contact office  This is the patient's preferred pharmacy:  [id number] - [place], [place] - [address] AT [facility] [address] [place] [address] Phone: [phone] Fax: [phone]  Is this the correct pharmacy for this prescription? Yes If no, delete pharmacy and type the correct one.   Has the prescription been filled recently? Yes  Is the patient out of the medication? Yes  Has the patient been seen for an appointment in the last year OR does the patient have an upcoming appointment? Yes  Can we respond through MyChart? No, prefers call to [phone]  Agent: Please be advised that Rx refills may take up to 3 business days. We ask that you follow-up with your pharmacy.

## 2024-08-05 MED ORDER — LEVOTHYROXINE SODIUM 200 MCG PO CAPS: 200.0000 ug | ORAL_CAPSULE | Freq: Every day | ORAL | 1 refills | Status: AC
